# Patient Record
Sex: Male | Born: 1935 | Race: White | Hispanic: No | Smoking: Former smoker
Health system: Southern US, Community
[De-identification: ages and names within clinical notes are randomized; demographics above are authoritative.]

## PROBLEM LIST (undated history)

## (undated) DIAGNOSIS — J449 Chronic obstructive pulmonary disease, unspecified: Secondary | ICD-10-CM

## (undated) DIAGNOSIS — H9193 Unspecified hearing loss, bilateral: Secondary | ICD-10-CM

## (undated) DIAGNOSIS — R7303 Prediabetes: Secondary | ICD-10-CM

## (undated) DIAGNOSIS — E119 Type 2 diabetes mellitus without complications: Secondary | ICD-10-CM

## (undated) DIAGNOSIS — Z9889 Other specified postprocedural states: Secondary | ICD-10-CM

## (undated) HISTORY — PX: VASECTOMY: SHX75

## (undated) HISTORY — PX: TONSILLECTOMY: SUR1361

## (undated) HISTORY — DX: Chronic obstructive pulmonary disease, unspecified: J44.9

## (undated) HISTORY — DX: Unspecified hearing loss, bilateral: H91.93

## (undated) HISTORY — DX: Other specified postprocedural states: Z98.890

## (undated) HISTORY — PX: CARDIAC CATHETERIZATION: SHX172

## (undated) HISTORY — DX: Type 2 diabetes mellitus without complications: E11.9

---

## 2000-11-24 ENCOUNTER — Ambulatory Visit: Admission: RE | Admit: 2000-11-24 | Discharge: 2000-11-24 | Payer: Self-pay | Admitting: Internal Medicine

## 2005-04-26 ENCOUNTER — Ambulatory Visit (HOSPITAL_COMMUNITY): Admission: RE | Admit: 2005-04-26 | Discharge: 2005-04-26 | Payer: Self-pay | Admitting: Pulmonary Disease

## 2005-07-24 ENCOUNTER — Ambulatory Visit: Payer: Self-pay | Admitting: *Deleted

## 2005-07-24 ENCOUNTER — Inpatient Hospital Stay (HOSPITAL_COMMUNITY): Admission: AD | Admit: 2005-07-24 | Discharge: 2005-07-25 | Payer: Self-pay | Admitting: Cardiology

## 2005-07-25 ENCOUNTER — Encounter: Payer: Self-pay | Admitting: Cardiology

## 2009-05-12 ENCOUNTER — Ambulatory Visit (HOSPITAL_COMMUNITY): Admission: RE | Admit: 2009-05-12 | Discharge: 2009-05-12 | Payer: Self-pay | Admitting: Pulmonary Disease

## 2009-05-25 ENCOUNTER — Ambulatory Visit: Payer: Self-pay | Admitting: Cardiology

## 2009-05-25 ENCOUNTER — Ambulatory Visit (HOSPITAL_COMMUNITY): Admission: RE | Admit: 2009-05-25 | Discharge: 2009-05-25 | Payer: Self-pay | Admitting: Pulmonary Disease

## 2009-05-25 ENCOUNTER — Encounter (INDEPENDENT_AMBULATORY_CARE_PROVIDER_SITE_OTHER): Payer: Self-pay | Admitting: Pulmonary Disease

## 2009-06-16 ENCOUNTER — Ambulatory Visit (HOSPITAL_COMMUNITY): Admission: RE | Admit: 2009-06-16 | Discharge: 2009-06-16 | Payer: Self-pay | Admitting: Pulmonary Disease

## 2010-05-01 LAB — BLOOD GAS, ARTERIAL
Bicarbonate: 25.2 mEq/L — ABNORMAL HIGH (ref 20.0–24.0)
FIO2: 21 %
pCO2 arterial: 40.8 mmHg (ref 35.0–45.0)

## 2010-06-29 NOTE — Procedures (Signed)
NAME:  Jeremy Leon, Jeremy Leon NO.:  [id number]   MEDICAL RECORD NO.:  [id number]          PATIENT TYPE:  OUT   LOCATION:                                FACILITY:  [facility]   PHYSICIAN:  Edward L. [name], M.D.DATE OF BIRTH:  06/29/1932   DATE OF PROCEDURE:  05/01/2005  DATE OF DISCHARGE:                              PULMONARY FUNCTION TEST   RESULTS:  1.  Spirometry shows a severe ventilatory defect with evidence of airflow      obstruction.  2.  Lung volumes show no evidence of restrictive lung disease.  3.  DLCO is mildly reduced.  4.  Arterial blood gases are normal.  5.  There is significant improvement with inhaled bronchodilator.      Edward L. [name], M.D.  Electronically Signed     ELH/MEDQ  D:  05/01/2005  T:  05/02/2005  Job:  [id number]

## 2010-06-29 NOTE — H&P (Signed)
NAME:  Jeremy Leon, Jeremy Leon NO.:  [id number]   MEDICAL RECORD NO.:  [id number]           PATIENT TYPE:   LOCATION:                                 FACILITY:   PHYSICIAN:  Vida Roller, M.D.   DATE OF BIRTH:  July 31, 1932   DATE OF ADMISSION:  07/24/2005  DATE OF DISCHARGE:                                HISTORY & PHYSICAL   PRIMARY Jeremy Leon:  Jeremy Leon. Jeremy Leon, M.D.   HISTORY OF PRESENT ILLNESS:  Jeremy Leon is a 77 year old man with no  significant past medical history, other than emphysema, who presents to my  office today on referral from Dr. Juanetta Leon with 2 days of discomfort in his  chest.  He states it is a fullness in the center of his chest that radiates  towards his neck.  It is associated with exertion.  He has had it on and off  for the last day or so.  It gets worse when he is physically active.  He  went to a funeral yesterday, and he was standing and walking and was a  little bit emotionally distraught over the deceased.  He had worsening  discomfort in his chest.  It went away.  He was able to sleep last night,  but got significantly worse this morning without exertion.  He has now had  about 7 or 8 hours of discomfort.  He was seen in Dr. Juanetta Leon' office.  An  electrocardiogram was performed, which did not show any acute ST-T changes,  and he was referred to my office for evaluation.  He states that the  discomfort gets worse when he takes a deep breath.  It sometimes goes up  into his neck.  It is rated currently at about a 2 now.  No PND or  orthopnea; however, he says it feels more comfortable when he sits up in a  recliner.   PAST MEDICAL HISTORY:  COPD.  He has an extensive previous tobacco history,  but quit smoking in 1999.  He denies diabetes or hypertension.  He does not  have a significant family history of coronary disease.   PAST SURGICAL HISTORY:  He has had a tonsillectomy and a vasectomy in the  past without difficulty.  The  tonsillectomy was when he was a child.   CURRENT MEDICATIONS:  1.  Advair 250/50 two puffs once a day.  2.  Testosterone gel he uses once a day.  3.  Multivitamin once a day.  4.  Aspirin 81 mg once a day, and he took that this morning.  5.  Vitamin E once a day.   REVIEW OF SYSTEMS:  Generally negative.  He has had an echocardiogram which  was performed in March of 2007, interpreted by me colleague, Dr. Kem Boroughs at [facility], which shows preserved LV  systolic function, no significant valvular heart disease, evidence of mild  left atrial enlargement with a question of diastolic relaxation abnormality  and a mildly dilated right ventricle with preserved right ventricular  systolic function.   PHYSICAL EXAMINATION:  VITAL SIGNS:  He is  229 pounds.  Blood pressure is  120/74, pulse 84.  HEENT:  Unremarkable.  NECK:  Supple.  I do not hear any carotid bruits or jugular venous  distention.  CHEST:  Decreased breath sounds at the bases with reasonably poor air  movement throughout.  No wheezes are noted.  No rales are noted.  CARDIOVASCULAR:  Somewhat distant without a significant murmur.  Regular  first and second heart sounds.  EXTREMITIES:  His lower extremities are without edema.  His pulses are 1+  throughout.  I do not hear any femoral bruits.   He had a chest x-ray showing emphysema back in March.  His  electrocardiogram, which was done in Dr. Juanetta Leon' office, showed sinus  rhythm at a rate of 89 with normal intervals.  Axis is normal.  He has no ST-  T wave changes.  There are no Q waves concerning for old myocardial  infarction.  Essentially, an unremarkable electrocardiogram.  I have no  laboratories and no recent chest x-ray.   So this is a gentleman with active discomfort in his chest which is very  concerning for unstable angina.  He will need to be admitted to the  hospital.  He has chosen to be admitted to [facility], so we will transport   him down via [facility].  I would like for him to be anticoagulated with  Lovenox.  He will need an aspirin.  He will need nitroglycerin infusion to  relief his chest discomfort.  He will need to have his enzymes cycled.  Will  need to check a D-dimer, and I would plan to take this gentleman to the  heart catheterization laboratory if he manifests any evidence of active  ischemia.  At this point, his characteristics of his pain, although quite  concerning and active, are not classic for coronary artery disease,  especially with the pleuritic nature to the pain.  Certainly, a pulmonary  embolism is worth at least considering, and this could also just be a COPD  exacerbation.  But at this point I have limited ability to differentiate  that, and I will treat him for the most significant potentially life-  threatening problem.      Vida Roller, M.D.  Electronically Signed     JH/MEDQ  D:  07/24/2005  T:  07/24/2005  Job:  [id number]   cc:   Ramon Dredge L. Jeremy Leon, M.D.  Fax: [phone]

## 2010-06-29 NOTE — Discharge Summary (Signed)
Jeremy Leon, Jeremy Leon NO.:  [id number]   MEDICAL RECORD NO.:  [id number]          PATIENT TYPE:  INP   LOCATION:  2910                         FACILITY:  [facility]   PHYSICIAN:  Theodore Demark, P.A. LHCDATE OF BIRTH:  Jul 11, 1932   DATE OF ADMISSION:  07/24/2005  DATE OF DISCHARGE:  07/25/2005                                 DISCHARGE SUMMARY   PROCEDURES:  1.  Cardiac catheterization.  2.  Coronary arteriogram.  3.  Left ventriculogram.  4.  CT of the chest with contrast.  5.  2-D echocardiogram.   TIME OF DISCHARGE:  36 minutes.   CHIEF COMPLAINT:  Chest pain, no coronary artery disease by cath and no PE  or dissection by CT of the chest.   SECONDARY DIAGNOSIS:  1.  Chronic obstructive pulmonary disease.  2.  Remote history of tobacco use.  3.  Status post tonsillectomy and vasectomy.  4.  Mild benign prostatic hypertrophy.  5.  Mild hearing loss.   HOSPITAL COURSE:  Jeremy Leon is a 77 year old male with no previous  history of coronary artery disease.  He was evaluated by Dr. [name] in the  office on July 24, 2005, for chest pain.  Jeremy Leon states that his  chest pain would come and go.  It was made worse by exertion or worse with  deep inspiration.  He was evaluated by Dr. [name] and the exertional  component of his chest pain was concerning for unstable anginal pain.  He  was transferred by [facility] to [facility].  En route to [facility] he had transient bradycardia and loss of consciousness.  He was  taken directly to the cath lab.  Cardiac catheterization showed no  significant coronary artery disease and preserved left ventricular function.  He was admitted for observation.  A D-dimer was checked and was elevated at  0.79.  The chest CT was performed and showed no PE or dissection.  Of note,  he had bilateral renal cysts and diverticulosis.  He also had gallstones but  no cholecystitis.  A 2-D echocardiogram was performed  and showed normal left  ventricular systolic function with no pericardial effusion, no pericarditis,  and no significant valvular abnormalities.  As part of his evaluation, a  lipid profile was performed and showed a total cholesterol of 173,  triglycerides 107, HDL 35, LDL 117.  He is encouraged to stick to a low-fat  diet and follow up with his family physician.  Of note, he was hyperglycemic  with blood sugars of 120 and 131.  A hemoglobin A1c was not performed.  TSH  was within normal limits.  He can follow up with Dr. [name] for further  evaluation.   On July 25, 2005, Jeremy Leon was still complaining of some pleuritic  chest pain but it was improved by Naprosyn.  Dr. Dietrich Pates felt that it was  likely chest wall pain with the pleuritic component to it.  Because a CT,  echocardiogram, and cardiac catheterization were all negative, he was  considered stable for discharge on July 25, 2005, with a non-steroidal  anti-  inflammatory drugs for pain control and follow up with Dr. [name].   DISCHARGE INSTRUCTIONS:  His activity level is to be reduced for the next 48  hours.  He is to stick to a low-fat diet.  He is to call our office for  problems with the cath site.  He is to follow up with Dr. [name] next week  as scheduled and follow up with Dr. [name] as needed.   DISCHARGE MEDICATIONS:  1.  Naprosyn 500 mg b.i.d.  2.  Aspirin 81 mg a day.  3.  Advair 2 puffs b.i.d.  4.  Testosterone gel once a day.  5.  Multivitamin and vitamin E once a day.      Theodore Demark, P.A. [facility]     RB/MEDQ  D:  07/25/2005  T:  07/25/2005  Job:  [id number]   cc:   Ramon Dredge L. [name], M.D.  Fax: [phone]

## 2010-06-29 NOTE — Cardiovascular Report (Signed)
NAMEJANOS, SHAMPINE NO.:  [id number]   MEDICAL RECORD NO.:  [id number]          PATIENT TYPE:  INP   LOCATION:  2807                         FACILITY:  [facility]   PHYSICIAN:  Arturo Morton. Riley Kill, M.D. [facility] OF BIRTH:  [birth year]   DATE OF PROCEDURE:  07/24/2005  DATE OF DISCHARGE:                              CARDIAC CATHETERIZATION   INDICATIONS:  Mr. Lehrmann is a 77 year old who presents with fairly  significant chest pain.  There was mild ST elevation noted on the inferior  leads.  He also had a vagal response after nitroglycerin was started.  The  patient is on a daily aspirin.  He quit smoking in 1999 but does have  underlying chronic lung disease.   PROCEDURE:  1.  Left heart catheterization.  2.  Selective coronary arteriography.  3.  Selective left ventriculography.  4.  Aortic root aortography.   DESCRIPTION OF PROCEDURE:  The patient was brought to the catheterization  laboratory, prepped and draped in usual fashion. Through an anterior  puncture the right femoral artery was easily entered and 6-French sheath was  placed. Views of left and right coronaries were obtained in multiple  angiographic projections.  Central aortic and left ventricular pressures  were measured with pigtail.  Ventriculography was performed in the RAO  projection. Because of the unidentified chest pain with the proximal root  aortography to exclude dissection.  The pigtail catheter was then  subsequently removed. I spoke with the patient's wife.  I subsequently  called Dr. [name], in Dr. [name].  I also notified Dr. Diona Browner.  The  patient was taken to the holding area in satisfactory clinical condition for  direct manual hemostasis.  There are no complications noted.   HEMODYNAMIC DATA.:  1.  Central aortic pressure 110/72, mean 87.  2.  Left atrial pressure 113/20.  3.  No gradient pullback across aortic valve.   ANGIOGRAPHIC DATA.:  1.  The left main coronary artery  is fairly large-caliber vessel and free of      critical disease.  2.  The LAD courses to the apex.  There was slightly sluggish flow down the      LAD, but there was no obvious area of high-grade focal obstruction.      There is a fair size diagonal branch that bifurcated distally.  This did      not appear to have significant obstruction.  There is a second diagonal      branch that was also fairly large in size and again without significant      focal narrowing.  3.  The circumflex has an insignificant ramus vessel.  There is a large      first marginal branch that is somewhat tortuous in its midportion, high-      grade critical disease is not noted.  There is a smaller second marginal      branch and moderate size third marginal branch all of which appear to be      free of critical obstruction.  4.  The right coronary is a large-caliber vessel.  Provides posterior  descending and a posterolateral vessel that has two major sub branches.      The right coronary artery appears to be free of significant disease and      smooth throughout without high-grade obstruction.  5.  The ventriculogram done in the RAO projection reveals vigorous global      systolic function.  Ejection fraction appears to be in excess of 55%.      There is slight delayed pull-up of the apex, but there does not appear      to be akinesis in this territory.  6.  Proximal root aortogram demonstrates no aortic regurgitation.  Aortic      root size appears to be upper normal.  There is no evidence of aortic      dissection that is evident.   CONCLUSION:  1.  Well-preserved left ventricular function.  2.  No evidence of aortic dissection.  3.  No critical coronary obstruction.   DISPOSITION:  We will get a D-dimer.  The patient will be put in the unit.  Serial enzymes obtained.  We will potentially look for other sources of  chest pain and keep him under 24-hour observation.      Arturo Morton. Riley Kill, M.D.  [facility]  Electronically Signed     TDS/MEDQ  D:  07/24/2005  T:  07/24/2005  Job:  [id number]   cc:   Ramon Dredge L. [name], M.D.  Fax: [phone]   Vida Roller, M.D.  Fax: [phone]

## 2010-06-29 NOTE — Procedures (Signed)
NAME:  Jeremy Leon, Jeremy Leon NO.:  [id number]   MEDICAL RECORD NO.:  [id number]          PATIENT TYPE:  OUT   LOCATION:  RAD                           FACILITY:  [facility]   PHYSICIAN:  Dani Gobble, MD       DATE OF BIRTH:  April 09, 1932   DATE OF PROCEDURE:  04/26/2005  DATE OF DISCHARGE:                                  ECHOCARDIOGRAM   REFERRING PHYSICIAN:  Edward L. [name], M.D.   INDICATIONS:  A 77 year old gentleman without prior cardiac history who has  been short of breath.   The technical quality of the study is limited secondary to patient body  habitus and poor acoustic windows.   M-MODE TRACINGS:  The aorta grossly appears normal in size.   Left atrium appears mildly dilated.   The interventricular septum and posterior wall may be borderline thickened.   The aortic valve was not well-visualized, but appeared to be grossly  structurally normal with normal leaflet excursion.  No significant aortic  insufficiency is noted.  Doppler interrogation of the aortic valve is within  normal limits.   Mitral valve also appears grossly structurally normal.  No significant  mitral regurgitation is noted on color-flow Doppler.  Doppler interrogation  of mitral valve is within normal limits..   The pulmonic valve is not well-visualized.   Tricuspid valve appears grossly structurally normal with mild tricuspid  regurgitation noted.   Left ventricle subjectively appears normal in size.  Overall ejection  fraction is normal.  The endocardium is not well-visualized, but no obvious  regional wall motion abnormalities are noted.  The presence of diastolic  dysfunction is inferred from pulse wave Doppler across the mitral valve.   The right atrium and right ventricle are dilated, but right ventricular  systolic function is preserved.   IMPRESSION:  1.  Technically difficult study.  2.  Mild left atrial enlargement.  3.  Borderline concentric left ventricular  hypertrophy.  4.  Mild tricuspid regurgitation.  5.  Normal left ventricular size and systolic function without obvious      regional wall motion abnormality, although      the endocardium is not well-visualized in all views.  6.  The dilated right atrium and right ventricle with preserved right      ventricular systolic function.  7.  Presence of diastolic dysfunction is inferred from pulse wave Doppler      across the mitral valve.           ______________________________  Dani Gobble, MD     AB/MEDQ  D:  04/26/2005  T:  04/27/2005  Job:  [id number]

## 2014-06-07 DIAGNOSIS — H2513 Age-related nuclear cataract, bilateral: Secondary | ICD-10-CM | POA: Diagnosis not present

## 2014-10-28 ENCOUNTER — Encounter: Payer: Self-pay | Admitting: Cardiology

## 2014-10-28 ENCOUNTER — Ambulatory Visit (INDEPENDENT_AMBULATORY_CARE_PROVIDER_SITE_OTHER): Payer: Medicare Other | Admitting: Cardiology

## 2014-10-28 VITALS — BP 150/70 | HR 63 | Ht 70.0 in | Wt 215.4 lb

## 2014-10-28 DIAGNOSIS — Z79899 Other long term (current) drug therapy: Secondary | ICD-10-CM

## 2014-10-28 DIAGNOSIS — J449 Chronic obstructive pulmonary disease, unspecified: Secondary | ICD-10-CM | POA: Diagnosis not present

## 2014-10-28 DIAGNOSIS — Z136 Encounter for screening for cardiovascular disorders: Secondary | ICD-10-CM | POA: Diagnosis not present

## 2014-10-28 DIAGNOSIS — R0602 Shortness of breath: Secondary | ICD-10-CM | POA: Diagnosis not present

## 2014-10-28 DIAGNOSIS — R0789 Other chest pain: Secondary | ICD-10-CM

## 2014-10-28 LAB — CBC WITH DIFFERENTIAL/PLATELET
BASOS ABS: 0 10*3/uL (ref 0.0–0.1)
BASOS PCT: 0 % (ref 0–1)
EOS ABS: 0.2 10*3/uL (ref 0.0–0.7)
Eosinophils Relative: 2 % (ref 0–5)
HCT: 44.8 % (ref 39.0–52.0)
Hemoglobin: 15 g/dL (ref 13.0–17.0)
Lymphocytes Relative: 26 % (ref 12–46)
Lymphs Abs: 2.4 10*3/uL (ref 0.7–4.0)
MCH: 29.9 pg (ref 26.0–34.0)
MCHC: 33.5 g/dL (ref 30.0–36.0)
MCV: 89.4 fL (ref 78.0–100.0)
MONOS PCT: 7 % (ref 3–12)
MPV: 10.5 fL (ref 8.6–12.4)
Monocytes Absolute: 0.6 10*3/uL (ref 0.1–1.0)
NEUTROS PCT: 65 % (ref 43–77)
Neutro Abs: 6 10*3/uL (ref 1.7–7.7)
PLATELETS: 253 10*3/uL (ref 150–400)
RBC: 5.01 MIL/uL (ref 4.22–5.81)
RDW: 13.4 % (ref 11.5–15.5)
WBC: 9.2 10*3/uL (ref 4.0–10.5)

## 2014-10-28 LAB — BASIC METABOLIC PANEL
BUN: 16 mg/dL (ref 7–25)
CALCIUM: 9.3 mg/dL (ref 8.6–10.3)
CO2: 26 mmol/L (ref 20–31)
Chloride: 107 mmol/L (ref 98–110)
Creat: 1.13 mg/dL — ABNORMAL HIGH (ref 0.70–1.11)
Glucose, Bld: 135 mg/dL — ABNORMAL HIGH (ref 65–99)
Potassium: 4.5 mmol/L (ref 3.5–5.3)
SODIUM: 141 mmol/L (ref 135–146)

## 2014-10-28 NOTE — Patient Instructions (Signed)
Your physician recommends that you schedule a follow-up appointment in: BASED ON TEST RESULTS  Your physician has requested that you have a lexiscan myoview. For further information please visit [email or web address] Please follow instruction sheet, as given.  Your physician has requested that you have an echocardiogram. Echocardiography is a painless test that uses sound waves to create images of your heart. It provides your doctor with information about the size and shape of your heart and how well your heart's chambers and valves are working. This procedure takes approximately one hour. There are no restrictions for this procedure.  Your physician recommends that you return for lab work in: TODAY- BMET, CBC  Thanks for choosing [facility]!!!

## 2014-10-28 NOTE — Progress Notes (Signed)
Cardiology Office Note  Date: 10/28/2014   ID: Jeremy Leon, DOB 02-15-1932, MRN [id number]  PCP: Fredirick Maudlin, MD  Consulting Cardiologist: Nona Dell, MD   Chief Complaint  Patient presents with  . Shortness of Breath    History of Present Illness: Jeremy Leon is an 81 y.o. male self-referred for cardiology evaluation. He was seen in our practice in the past, by Dr. [name] back in 2007. I reviewed the available records. He follows with Dr. [name] with a history of COPD, but has not seen him in a few years.  He presents today with his wife, describes a long-standing history of dyspnea on exertion and NYHA class 2-3. He has had worsening symptoms over the hot summer months, states that within the last week he has felt somewhat better. At times he feels a vague discomfort in the left lower portion of his thorax not consistently with activity. Also states that he "sweats a lot" when he exerts himself. He has had no palpitations or syncope.  Cardiac evaluation includes cardiac catheterization in 2007 which did not demonstrate any significant CAD. ECG today shows sinus rhythm with decreased R wave progression.  We reviewed his medications, no recent additions. He reports regular use of his pulmonary medication. Very rarely has to use a rescue inhaler. He has not had any recent lab work.   Past Medical History  Diagnosis Date  . COPD (chronic obstructive pulmonary disease)   . History of cardiac catheterization     No significant CAD 2007  . Decreased hearing of both ears     Past Surgical History  Procedure Laterality Date  . Tonsillectomy    . Vasectomy      Current Outpatient Prescriptions  Medication Sig Dispense Refill  . aspirin 81 MG tablet Take 81 mg by mouth daily.    . cyanocobalamin 1000 MCG tablet Take 100 mcg by mouth daily.    . Omega-3 Fatty Acids (FISH OIL) 1000 MG CPDR Take 1,000 mg by mouth.    . tiotropium (SPIRIVA) 18 MCG inhalation  capsule Place 18 mcg into inhaler and inhale daily.    . Vitamin D-Vitamin K (D3 + K2 DOTS) 1000-90 UNIT-MCG TABS Take by mouth.     No current facility-administered medications for this visit.    Allergies:  Review of patient's allergies indicates not on file.   Social History: The patient  reports that he quit smoking about 17 years ago. His smoking use included Cigarettes. He does not have any smokeless tobacco history on file. He reports that he does not drink alcohol or use illicit drugs.   Family History: The patient's family history includes Colon cancer in his father; Diabetes Mellitus II in his brother; Leukemia in his brother; Stroke in his mother.   ROS:  Please see the history of present illness. Otherwise, complete review of systems is positive for decreased hearing.  All other systems are reviewed and negative.   Physical Exam: VS:  BP 150/70 mmHg  Pulse 63  Ht 5\' 10"  (1.778 m)  Wt 215 lb 6.4 oz (97.705 kg)  BMI 30.91 kg/m2  SpO2 92%, BMI Body mass index is 30.91 kg/(m^2).  Wt Readings from Last 3 Encounters:  10/28/14 215 lb 6.4 oz (97.705 kg)     General: Overweight male, appears comfortable at rest. HEENT: Conjunctiva and lids normal, oropharynx clear with moist mucosa. Neck: Supple, no elevated JVP or carotid bruits, no thyromegaly. Lungs: Decreased breath sounds without wheezing, nonlabored breathing  at rest. Cardiac: Regular rate and rhythm, no S3 or significant systolic murmur, no pericardial rub. Abdomen: Soft, nontender, bowel sounds present, no guarding or rebound. Extremities: No pitting edema, distal pulses 2+. Skin: Warm and dry. Musculoskeletal: No kyphosis. Neuropsychiatric: Alert and oriented x3, affect grossly appropriate.   ECG: ECG is ordered today.  Other Studies Reviewed Today:  Echocardiogram 05/25/2009: Study Conclusions  - Left ventricle: The cavity size was normal. Wall thickness was  increased. Systolic function was normal. The  estimated ejection  fraction was in the range of 55% to 60%. Suboptimal images failed  to identify aregional wall motion abnormalities. - Aortic valve: Mildly to moderately calcified annulus. Mildly  calcified leaflets. - Right ventricle: The cavity size was normal. Wall thickness was  mildly increased. Impressions:  - Compared to the prior study of 07/25/2005, there has been no  significant interval change.  ASSESSMENT AND PLAN:  1. Acute on chronic dyspnea on exertion with diaphoresis and atypical chest discomfort at times. ECG is overall nonspecific. Previous cardiac workup was reassuring including no significant CAD at heart catheterization in 2007. He also has COPD by history. We had him ambulate in the office on room air with no significant oxygen desaturation documented below 92%. We will send basic lab work including CBC and BMET. Schedule an echocardiogram and a Lexiscan Cardiolite for follow-up cardiac structural and ischemic assessment. If reassuring, may need further pulmonary evaluation by Dr. [name].  2. COPD by history, quit tobacco use in 1999.  Current medicines were reviewed at length with the patient today.   Orders Placed This Encounter  Procedures  . NM Myocar Multi W/Spect W/Wall Motion / EF  . CBC with Differential  . Basic Metabolic Panel (BMET)  . Myocardial Perfusion Imaging  . EKG 12-Lead  . Echocardiogram    Disposition: Call with results.   Signed, Jonelle Sidle, MD, [facility] 10/28/2014 9:36 AM    [facility] [address] [place], [place] [address] Phone: [phone]; Fax: [phone]

## 2014-11-01 ENCOUNTER — Ambulatory Visit (HOSPITAL_COMMUNITY)
Admission: RE | Admit: 2014-11-01 | Discharge: 2014-11-01 | Disposition: A | Discharge status: Home / Self Care | Payer: Medicare Other | Source: Ambulatory Visit | Attending: Cardiology | Admitting: Cardiology

## 2014-11-01 ENCOUNTER — Encounter (HOSPITAL_COMMUNITY)
Admission: RE | Admit: 2014-11-01 | Discharge: 2014-11-01 | Disposition: A | Discharge status: Home / Self Care | Payer: Medicare Other | Source: Ambulatory Visit | Attending: Cardiology | Admitting: Cardiology

## 2014-11-01 ENCOUNTER — Encounter (HOSPITAL_COMMUNITY): Payer: Self-pay

## 2014-11-01 ENCOUNTER — Inpatient Hospital Stay (HOSPITAL_COMMUNITY): Admission: RE | Admit: 2014-11-01 | Payer: Medicare Other | Source: Ambulatory Visit

## 2014-11-01 DIAGNOSIS — R0602 Shortness of breath: Secondary | ICD-10-CM | POA: Insufficient documentation

## 2014-11-01 DIAGNOSIS — I081 Rheumatic disorders of both mitral and tricuspid valves: Secondary | ICD-10-CM | POA: Diagnosis not present

## 2014-11-01 LAB — NM MYOCAR MULTI W/SPECT W/WALL MOTION / EF
CHL CUP NUCLEAR SRS: 7
CHL CUP NUCLEAR SSS: 7
LHR: 0.37
LV sys vol: 30 mL
LVDIAVOL: 79 mL
NUC STRESS TID: 1.11
Peak HR: 85 {beats}/min
Rest HR: 64 {beats}/min
SDS: 0

## 2014-11-01 MED ORDER — TECHNETIUM TC 99M SESTAMIBI GENERIC - CARDIOLITE
10.0000 | Freq: Once | INTRAVENOUS | Status: AC | PRN
  Administered 2014-11-01: 10 via INTRAVENOUS

## 2014-11-01 MED ORDER — TECHNETIUM TC 99M SESTAMIBI - CARDIOLITE
30.0000 | Freq: Once | INTRAVENOUS | Status: AC | PRN
  Administered 2014-11-01: 30 via INTRAVENOUS

## 2014-11-01 MED ORDER — REGADENOSON 0.4 MG/5ML IV SOLN
INTRAVENOUS | Status: AC
  Administered 2014-11-01: 0.4 mg via INTRAVENOUS
  Filled 2014-11-01: qty 5

## 2014-11-01 MED ORDER — SODIUM CHLORIDE 0.9 % IJ SOLN
INTRAMUSCULAR | Status: AC
  Administered 2014-11-01: 10 mL via INTRAVENOUS
  Filled 2014-11-01: qty 3

## 2016-07-25 IMAGING — NM NM MYOCAR MULTI W/SPECT W/WALL MOTION & EF
2 series · 12 of 12 positions shown · non-contrast
Comparison: none

[Series 1: rest · 8.28mm/px · 6 of 64 frames shown]
[frame 6/64]
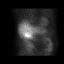
[frame 16/64]
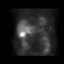
[frame 27/64]
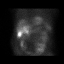
[frame 38/64]
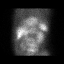
[frame 48/64]
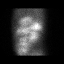
[frame 59/64]
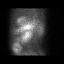

[Series 2: stress gated · 8.28mm/px · 6 of 64 frames shown]
[frame 6/64]
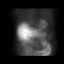
[frame 16/64]
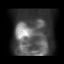
[frame 27/64]
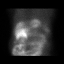
[frame 38/64]
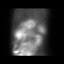
[frame 48/64]
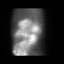
[frame 59/64]
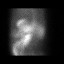

[12 of 12 positions shown; findings below may reference images not displayed]

Canned report from images found in remote index.

Refer to host system for actual result text.

## 2016-08-12 ENCOUNTER — Observation Stay (HOSPITAL_COMMUNITY)
Admission: EM | Admit: 2016-08-12 | Discharge: 2016-08-14 | Disposition: A | Discharge status: Home / Self Care | Payer: Medicare Other | Attending: Pulmonary Disease | Admitting: Pulmonary Disease

## 2016-08-12 ENCOUNTER — Encounter (HOSPITAL_COMMUNITY): Payer: Self-pay | Admitting: Emergency Medicine

## 2016-08-12 ENCOUNTER — Emergency Department (HOSPITAL_COMMUNITY): Payer: Medicare Other

## 2016-08-12 DIAGNOSIS — J9601 Acute respiratory failure with hypoxia: Secondary | ICD-10-CM | POA: Diagnosis not present

## 2016-08-12 DIAGNOSIS — R05 Cough: Secondary | ICD-10-CM | POA: Diagnosis not present

## 2016-08-12 DIAGNOSIS — R0603 Acute respiratory distress: Secondary | ICD-10-CM | POA: Diagnosis not present

## 2016-08-12 DIAGNOSIS — Z79899 Other long term (current) drug therapy: Secondary | ICD-10-CM | POA: Insufficient documentation

## 2016-08-12 DIAGNOSIS — E119 Type 2 diabetes mellitus without complications: Secondary | ICD-10-CM

## 2016-08-12 DIAGNOSIS — J441 Chronic obstructive pulmonary disease with (acute) exacerbation: Principal | ICD-10-CM | POA: Diagnosis present

## 2016-08-12 DIAGNOSIS — Z87891 Personal history of nicotine dependence: Secondary | ICD-10-CM | POA: Diagnosis not present

## 2016-08-12 DIAGNOSIS — E1140 Type 2 diabetes mellitus with diabetic neuropathy, unspecified: Secondary | ICD-10-CM

## 2016-08-12 DIAGNOSIS — Z7982 Long term (current) use of aspirin: Secondary | ICD-10-CM | POA: Insufficient documentation

## 2016-08-12 DIAGNOSIS — J449 Chronic obstructive pulmonary disease, unspecified: Secondary | ICD-10-CM | POA: Diagnosis present

## 2016-08-12 DIAGNOSIS — E871 Hypo-osmolality and hyponatremia: Secondary | ICD-10-CM | POA: Diagnosis not present

## 2016-08-12 DIAGNOSIS — R0602 Shortness of breath: Secondary | ICD-10-CM | POA: Diagnosis not present

## 2016-08-12 HISTORY — DX: Prediabetes: R73.03

## 2016-08-12 LAB — CBC WITH DIFFERENTIAL/PLATELET
BASOS ABS: 0 10*3/uL (ref 0.0–0.1)
BASOS PCT: 0 %
EOS ABS: 0 10*3/uL (ref 0.0–0.7)
EOS PCT: 0 %
HCT: 40.1 % (ref 39.0–52.0)
Hemoglobin: 14.1 g/dL (ref 13.0–17.0)
Lymphocytes Relative: 12 %
Lymphs Abs: 1.7 10*3/uL (ref 0.7–4.0)
MCH: 30.5 pg (ref 26.0–34.0)
MCHC: 35.2 g/dL (ref 30.0–36.0)
MCV: 86.6 fL (ref 78.0–100.0)
MONO ABS: 1.4 10*3/uL — AB (ref 0.1–1.0)
Monocytes Relative: 10 %
Neutro Abs: 11.1 10*3/uL — ABNORMAL HIGH (ref 1.7–7.7)
Neutrophils Relative %: 78 %
PLATELETS: 209 10*3/uL (ref 150–400)
RBC: 4.63 MIL/uL (ref 4.22–5.81)
RDW: 12.8 % (ref 11.5–15.5)
WBC: 14.2 10*3/uL — ABNORMAL HIGH (ref 4.0–10.5)

## 2016-08-12 LAB — BASIC METABOLIC PANEL
ANION GAP: 12 (ref 5–15)
BUN: 19 mg/dL (ref 6–20)
CALCIUM: 8.7 mg/dL — AB (ref 8.9–10.3)
CO2: 21 mmol/L — ABNORMAL LOW (ref 22–32)
Chloride: 100 mmol/L — ABNORMAL LOW (ref 101–111)
Creatinine, Ser: 0.99 mg/dL (ref 0.61–1.24)
GLUCOSE: 293 mg/dL — AB (ref 65–99)
Potassium: 3.8 mmol/L (ref 3.5–5.1)
Sodium: 133 mmol/L — ABNORMAL LOW (ref 135–145)

## 2016-08-12 LAB — BLOOD GAS, ARTERIAL
Acid-base deficit: 5.7 mmol/L — ABNORMAL HIGH (ref 0.0–2.0)
Bicarbonate: 20.6 mmol/L (ref 20.0–28.0)
Drawn by: 23534
FIO2: 0.21
O2 CONTENT: 21 L/min
O2 Saturation: 90.4 %
PCO2 ART: 28.8 mmHg — AB (ref 32.0–48.0)
pH, Arterial: 7.414 (ref 7.350–7.450)
pO2, Arterial: 61 mmHg — ABNORMAL LOW (ref 83.0–108.0)

## 2016-08-12 LAB — TROPONIN I: Troponin I: <0.03 ng/mL (ref <0.03)

## 2016-08-12 LAB — GLUCOSE, CAPILLARY
GLUCOSE-CAPILLARY: 397 mg/dL — AB (ref 65–99)
GLUCOSE-CAPILLARY: 387 mg/dL — AB (ref 65–99)
Glucose-Capillary: 352 mg/dL — ABNORMAL HIGH (ref 65–99)

## 2016-08-12 LAB — BRAIN NATRIURETIC PEPTIDE: B Natriuretic Peptide: 108 pg/mL — ABNORMAL HIGH (ref 0.0–100.0)

## 2016-08-12 MED ORDER — LEVOFLOXACIN IN D5W 750 MG/150ML IV SOLN: 750.0000 mg | INTRAVENOUS | Status: DC

## 2016-08-12 MED ORDER — INSULIN GLARGINE 100 UNIT/ML ~~LOC~~ SOLN
10.0000 [IU] | Freq: Two times a day (BID) | SUBCUTANEOUS | Status: DC
  Administered 2016-08-12 – 2016-08-14 (×5): 10 [IU] via SUBCUTANEOUS
  Filled 2016-08-12 (×7): qty 0.1

## 2016-08-12 MED ORDER — MAGNESIUM OXIDE 400 (241.3 MG) MG PO TABS
400.0000 mg | ORAL_TABLET | Freq: Every day | ORAL | Status: DC
  Administered 2016-08-13 – 2016-08-14 (×2): 400 mg via ORAL
  Filled 2016-08-12 (×2): qty 1

## 2016-08-12 MED ORDER — IPRATROPIUM-ALBUTEROL 0.5-2.5 (3) MG/3ML IN SOLN
3.0000 mL | Freq: Four times a day (QID) | RESPIRATORY_TRACT | Status: DC
  Administered 2016-08-12 (×2): 3 mL via RESPIRATORY_TRACT
  Filled 2016-08-12 (×2): qty 3

## 2016-08-12 MED ORDER — DOCUSATE SODIUM 100 MG PO CAPS
100.0000 mg | ORAL_CAPSULE | Freq: Two times a day (BID) | ORAL | Status: DC
  Administered 2016-08-12 – 2016-08-14 (×5): 100 mg via ORAL
  Filled 2016-08-12 (×5): qty 1

## 2016-08-12 MED ORDER — LEVOFLOXACIN IN D5W 750 MG/150ML IV SOLN
750.0000 mg | Freq: Once | INTRAVENOUS | Status: AC
  Administered 2016-08-12: 750 mg via INTRAVENOUS
  Filled 2016-08-12: qty 150

## 2016-08-12 MED ORDER — GUAIFENESIN ER 600 MG PO TB12
600.0000 mg | ORAL_TABLET | Freq: Two times a day (BID) | ORAL | Status: DC
  Administered 2016-08-12 – 2016-08-14 (×5): 600 mg via ORAL
  Filled 2016-08-12 (×5): qty 1

## 2016-08-12 MED ORDER — OXYCODONE HCL 5 MG PO TABS: 5.0000 mg | ORAL_TABLET | ORAL | Status: DC | PRN

## 2016-08-12 MED ORDER — MOMETASONE FURO-FORMOTEROL FUM 200-5 MCG/ACT IN AERO
2.0000 | INHALATION_SPRAY | Freq: Two times a day (BID) | RESPIRATORY_TRACT | Status: DC
  Administered 2016-08-12 – 2016-08-14 (×4): 2 via RESPIRATORY_TRACT
  Filled 2016-08-12: qty 8.8

## 2016-08-12 MED ORDER — IPRATROPIUM-ALBUTEROL 0.5-2.5 (3) MG/3ML IN SOLN
3.0000 mL | Freq: Three times a day (TID) | RESPIRATORY_TRACT | Status: DC
  Administered 2016-08-13 – 2016-08-14 (×4): 3 mL via RESPIRATORY_TRACT
  Filled 2016-08-12 (×4): qty 3

## 2016-08-12 MED ORDER — POTASSIUM CHLORIDE IN NACL 20-0.9 MEQ/L-% IV SOLN
INTRAVENOUS | Status: AC
  Administered 2016-08-12 – 2016-08-13 (×2): via INTRAVENOUS

## 2016-08-12 MED ORDER — ACETAMINOPHEN 650 MG RE SUPP: 650.0000 mg | Freq: Four times a day (QID) | RECTAL | Status: DC | PRN

## 2016-08-12 MED ORDER — BENZONATATE 100 MG PO CAPS: 100.0000 mg | ORAL_CAPSULE | Freq: Three times a day (TID) | ORAL | Status: DC | PRN

## 2016-08-12 MED ORDER — ALBUTEROL SULFATE (2.5 MG/3ML) 0.083% IN NEBU: 2.5000 mg | INHALATION_SOLUTION | RESPIRATORY_TRACT | Status: DC | PRN

## 2016-08-12 MED ORDER — METHYLPREDNISOLONE SODIUM SUCC 125 MG IJ SOLR
125.0000 mg | Freq: Once | INTRAMUSCULAR | Status: AC
  Administered 2016-08-12: 125 mg via INTRAVENOUS
  Filled 2016-08-12: qty 2

## 2016-08-12 MED ORDER — VITAMIN E 45 MG (100 UNIT) PO CAPS
1000.0000 [IU] | ORAL_CAPSULE | Freq: Every day | ORAL | Status: DC
  Administered 2016-08-13 – 2016-08-14 (×2): 1000 [IU] via ORAL
  Filled 2016-08-12 (×4): qty 2

## 2016-08-12 MED ORDER — ACETAMINOPHEN 325 MG PO TABS: 650.0000 mg | ORAL_TABLET | Freq: Four times a day (QID) | ORAL | Status: DC | PRN

## 2016-08-12 MED ORDER — INSULIN ASPART 100 UNIT/ML ~~LOC~~ SOLN
0.0000 [IU] | Freq: Every day | SUBCUTANEOUS | Status: DC
  Administered 2016-08-12: 5 [IU] via SUBCUTANEOUS
  Administered 2016-08-13: 3 [IU] via SUBCUTANEOUS

## 2016-08-12 MED ORDER — ENOXAPARIN SODIUM 40 MG/0.4ML ~~LOC~~ SOLN
40.0000 mg | SUBCUTANEOUS | Status: DC
  Administered 2016-08-12 – 2016-08-13 (×2): 40 mg via SUBCUTANEOUS
  Filled 2016-08-12 (×2): qty 0.4

## 2016-08-12 MED ORDER — VITAMIN B-12 100 MCG PO TABS
100.0000 ug | ORAL_TABLET | Freq: Every day | ORAL | Status: DC
  Administered 2016-08-13 – 2016-08-14 (×2): 100 ug via ORAL
  Filled 2016-08-12 (×2): qty 1

## 2016-08-12 MED ORDER — ASPIRIN 81 MG PO CHEW
81.0000 mg | CHEWABLE_TABLET | Freq: Every day | ORAL | Status: DC
  Administered 2016-08-13 – 2016-08-14 (×2): 81 mg via ORAL
  Filled 2016-08-12 (×2): qty 1

## 2016-08-12 MED ORDER — BENZONATATE 100 MG PO CAPS
100.0000 mg | ORAL_CAPSULE | Freq: Three times a day (TID) | ORAL | Status: DC
  Administered 2016-08-12 – 2016-08-14 (×6): 100 mg via ORAL
  Filled 2016-08-12 (×6): qty 1

## 2016-08-12 MED ORDER — ADULT MULTIVITAMIN W/MINERALS CH
ORAL_TABLET | Freq: Every day | ORAL | Status: DC
  Administered 2016-08-13 – 2016-08-14 (×2): 1 via ORAL
  Filled 2016-08-12 (×2): qty 1

## 2016-08-12 MED ORDER — OMEGA-3-ACID ETHYL ESTERS 1 G PO CAPS
1.0000 g | ORAL_CAPSULE | Freq: Every day | ORAL | Status: DC
  Administered 2016-08-13 – 2016-08-14 (×2): 1 g via ORAL
  Filled 2016-08-12 (×2): qty 1

## 2016-08-12 MED ORDER — INSULIN ASPART 100 UNIT/ML ~~LOC~~ SOLN
0.0000 [IU] | Freq: Three times a day (TID) | SUBCUTANEOUS | Status: DC
  Administered 2016-08-12 (×2): 20 [IU] via SUBCUTANEOUS
  Administered 2016-08-13 (×2): 15 [IU] via SUBCUTANEOUS
  Administered 2016-08-14: 7 [IU] via SUBCUTANEOUS

## 2016-08-12 MED ORDER — ALBUTEROL (5 MG/ML) CONTINUOUS INHALATION SOLN
10.0000 mg/h | INHALATION_SOLUTION | RESPIRATORY_TRACT | Status: DC
  Administered 2016-08-12: 10 mg/h via RESPIRATORY_TRACT
  Filled 2016-08-12: qty 20

## 2016-08-12 MED ORDER — ONDANSETRON HCL 4 MG PO TABS: 4.0000 mg | ORAL_TABLET | Freq: Four times a day (QID) | ORAL | Status: DC | PRN

## 2016-08-12 MED ORDER — ONDANSETRON HCL 4 MG/2ML IJ SOLN: 4.0000 mg | Freq: Four times a day (QID) | INTRAMUSCULAR | Status: DC | PRN

## 2016-08-12 MED ORDER — METHYLPREDNISOLONE SODIUM SUCC 125 MG IJ SOLR
80.0000 mg | Freq: Two times a day (BID) | INTRAMUSCULAR | Status: DC
  Administered 2016-08-12: 80 mg via INTRAVENOUS
  Filled 2016-08-12 (×2): qty 2

## 2016-08-12 NOTE — ED Provider Notes (Signed)
[facility] DEPT Provider Note   CSN: [id number] Arrival date & time: 08/12/16  0754     History   Chief Complaint Chief Complaint  Patient presents with  . Shortness of Breath    HPI Jeremy Leon is a 83 y.o. male.  HPI  The patient is an 83 year old male, he is a known history of COPD, uses spur Riva and inhalers as needed for shortness of breath but has never been admitted to the hospital or been on recent prednisone. He has no history of cardiac disease, no stenting, does not take any medication for blood pressure or diabetes and is otherwise healthy. He reports that he was around his family member who had a sore throat last week, a couple of days after this he developed a sore throat as well and then started to cough. This cough is been dry and hacking but today started to have some phlegm. He has been very short of breath, his symptoms became very severe today prompting his visit to the emergency department. He denies fevers or chills or swelling of the legs.  Past Medical History:  Diagnosis Date  . COPD (chronic obstructive pulmonary disease) (HCC)   . Decreased hearing of both ears   . History of cardiac catheterization    No significant CAD 2007    There are no active problems to display for this patient.   Past Surgical History:  Procedure Laterality Date  . TONSILLECTOMY    . VASECTOMY         Home Medications    Prior to Admission medications   Medication Sig Start Date End Date Taking? Authorizing Provider  aspirin 81 MG tablet Take 81 mg by mouth daily.    [provider]  cyanocobalamin 1000 MCG tablet Take 100 mcg by mouth daily.    [provider]  Omega-3 Fatty Acids (FISH OIL) 1000 MG CPDR Take 1,000 mg by mouth.    [provider]  tiotropium (SPIRIVA) 18 MCG inhalation capsule Place 18 mcg into inhaler and inhale daily.    [provider]  Vitamin D-Vitamin K (D3 + K2 DOTS) 1000-90 UNIT-MCG TABS Take by  mouth.    [provider]    Family History Family History  Problem Relation Age of Onset  . Colon cancer Father        Died age 81  . Stroke Mother   . Leukemia Brother        Died age 81  . Diabetes Mellitus II Brother     Social History Social History  Substance Use Topics  . Smoking status: Former Smoker    Types: Cigarettes    Quit date: 02/11/1997  . Smokeless tobacco: Never Used  . Alcohol use No     Allergies   Amoxicillin   Review of Systems Review of Systems  All other systems reviewed and are negative.    Physical Exam Updated Vital Signs BP (!) 149/82 (BP Location: Left Arm)   Pulse 97   Temp 98.5 F (36.9 C) (Oral)   Resp (!) 24   Ht 5' 10.5" (1.791 m)   Wt 95.3 kg (210 lb)   SpO2 (!) 89%   BMI 29.71 kg/m   Physical Exam  Constitutional: He appears well-developed and well-nourished. He appears distressed.  HENT:  Head: Normocephalic and atraumatic.  Mouth/Throat: No oropharyngeal exudate.  Dry mucous membranes  Eyes: Conjunctivae and EOM are normal. Pupils are equal, round, and reactive to light. Right eye exhibits  no discharge. Left eye exhibits no discharge. No scleral icterus.  Neck: Normal range of motion. Neck supple. No JVD present. No thyromegaly present.  Cardiovascular: Normal rate, regular rhythm, normal heart sounds and intact distal pulses.  Exam reveals no gallop and no friction rub.   No murmur heard. Pulmonary/Chest: He is in respiratory distress. He has wheezes ( Diminished breath sounds bilaterally with end expiratory wheezing). He has no rales.  Abdominal: Soft. Bowel sounds are normal. He exhibits no distension and no mass. There is no tenderness.  Musculoskeletal: Normal range of motion. He exhibits no edema or tenderness.  Lymphadenopathy:    He has no cervical adenopathy.  Neurological: He is alert. Coordination normal.  Skin: Skin is warm and dry. No rash noted. No erythema.  Psychiatric: He has a normal mood  and affect. His behavior is normal.  Nursing note and vitals reviewed.    ED Treatments / Results  Labs (all labs ordered are listed, but only abnormal results are displayed) Labs Reviewed  CBC WITH DIFFERENTIAL/PLATELET  BASIC METABOLIC PANEL  BRAIN NATRIURETIC PEPTIDE  TROPONIN I    EKG  EKG Interpretation  Date/Time:  Monday August 12 2016 08:03:46 EDT Ventricular Rate:  95 PR Interval:    QRS Duration: 105 QT Interval:  352 QTC Calculation: 443 R Axis:   -43 Text Interpretation:  Sinus rhythm Atrial premature complex Left axis deviation Abnormal R-wave progression, early transition since last tracing no significant change Confirmed by Eber Hong ([id number]) on 08/12/2016 8:11:16 AM        Radiology No results found.  Procedures Procedures (including critical care time)  Medications Ordered in ED Medications  albuterol (PROVENTIL,VENTOLIN) solution continuous neb (not administered)  methylPREDNISolone sodium succinate (SOLU-MEDROL) 125 mg/2 mL injection 125 mg (not administered)     Initial Impression / Assessment and Plan / ED Course  I have reviewed the triage vital signs and the nursing notes.  Pertinent labs & imaging results that were available during my care of the patient were reviewed by me and considered in my medical decision making (see chart for details).  Clinical Course as of Aug 15 737  Mon Aug 12, 2016  0913 Labs reviewed and show a white blood count of 14,000, neutrophil predominance, electrolytes show hyperglycemia with no anion gap acidosis, slightly low sodium consistent with hyperglycemia. Troponin and BNP are unremarkable. I had a long discussion with the patient regarding the treatment of his illness including the treatment for possible pneumonia given his x-ray showing some atelectasis, scarring or mild infiltrates at the bases. He is agreeable and is aware of the side effects of Levaquin. He is looking better on oxygen and on nebulized  treatments and is now speaking in full sentences and looking more comfortable. We will also start antidiabetic medications for the patient. I will discuss this with his family doctor, Dr. [name] who has been paged at 9:10 AM  [BM]  1031 Reevaluated after ambulation - dyspneic and hypoxic dropping to 84% on ambulation - discussed with Dr. Sherrie Mustache who requests ABG to assist with placement in step down vs tele.  [BM]    Clinical Course User Index [BM] Eber Hong, MD    COPD exacerbation Sat's 85% on RA Needs supplemental O2, Needs Nebs Needs CXR to r/o pna Solumedrol and recheck Cardiac monitoring,  Doubt CHF given no edema, on lungs, legs or JVD.  Pressure is slightly elevated Pt concerned abouth possibl hyperglycemia as well.  Likely severe COPD, still having significnat hypoxia  after nebs - CC provided, admitted to hospitalist.  CRITICAL CARE Performed by: Vida Roller Total critical care time: 35 minutes Critical care time was exclusive of separately billable procedures and treating other patients. Critical care was necessary to treat or prevent imminent or life-threatening deterioration. Critical care was time spent personally by me on the following activities: development of treatment plan with patient and/or surrogate as well as nursing, discussions with consultants, evaluation of patient's response to treatment, examination of patient, obtaining history from patient or surrogate, ordering and performing treatments and interventions, ordering and review of laboratory studies, ordering and review of radiographic studies, pulse oximetry and re-evaluation of patient's condition.   Final Clinical Impressions(s) / ED Diagnoses   Final diagnoses:  COPD exacerbation (HCC)  Respiratory distress  New Prescriptions New Prescriptions   No medications on file     Eber Hong, MD 08/15/16 [phone]

## 2016-08-12 NOTE — ED Triage Notes (Addendum)
PT states SOB with chest tightness that started last week with a cough and sore throat. PT short of breath on exertion with lung sounds clear. PT 89% on room air. PT c/o decreased appetite and generalized malaise.

## 2016-08-12 NOTE — ED Notes (Signed)
RT at bedside.

## 2016-08-12 NOTE — H&P (Signed)
History and Physical    Jeremy Leon:[id number] DOB: 19-Jun-1932 DOA: 08/12/2016  PCP: Kari Baars, MD  Patient coming from: home  I have personally briefly reviewed patient's old medical records in [facility]  Chief Complaint: shortness of breath  HPI: Jeremy Leon is a 83 y.o. male with medical history significant for COPD-not on oxygen-but treated with Spiriva and Advair and borderline diabetes,who presents with a several-day history of progressive shortness of breath. His symptoms started several days ago. It began with a sore throat and a nonproductive cough after being exposed to a family member that recently had URI symptoms. His shortness of breath is at rest, but worse with activity.he denies orthopnea or PND type symptoms. He has had an intermittently dry and productive cough with off-colored grayish sputum. He has chest wall pain when he coughs, otherwise he denies angina- type chest pain, pleurisy, fever, chills, nausea, vomiting, abdominal pain, or unusual leg swelling. He denies ear ache. His sore throat has resolved.  ED Course: In the ED, his respiratory rate was in the upper 20s. He was oxygenating 89%-94% on 2 L oxygen, mildly tachycardic, and normotensive. His ABG on room air: 7.06/10/59. Other lab data were remarkable for a WBC of 14.2, sodium of 133, glucose of 293, and BNP of 108. His chest x-ray revealed scarring in the lung bases without edema or consolidation; aortic arthrosclerosis. He was admitted for COPD exacerbation.  Review of Systems: As per HPI otherwise 10 point review of systems negative.    Past Medical History:  Diagnosis Date  . Borderline diabetes   . COPD (chronic obstructive pulmonary disease) (HCC)   . Decreased hearing of both ears   . History of cardiac catheterization    No significant CAD 2007    Past Surgical History:  Procedure Laterality Date  . TONSILLECTOMY    . VASECTOMY      Social History-He is married. He  reports that he quit smoking about 19 years ago. His smoking use included Cigarettes. He has never used smokeless tobacco. He reports that he does not drink alcohol or use drugs.  Allergies  Allergen Reactions  . Amoxicillin     Itching     Family History  Problem Relation Age of Onset  . Colon cancer Father        Died age 39  . Stroke Mother   . Leukemia Brother        Died age 59  . Diabetes Mellitus II Brother      Prior to Admission medications   Medication Sig Start Date End Date Taking? Authorizing Provider  aspirin 81 MG tablet Take 81 mg by mouth daily.   Yes [provider]  cyanocobalamin 1000 MCG tablet Take 100 mcg by mouth daily.   Yes [provider]  Fluticasone-Salmeterol (ADVAIR) 250-50 MCG/DOSE AEPB Inhale 1 puff into the lungs 2 (two) times daily.   Yes [provider]  Magnesium 250 MG TABS Take 1 tablet by mouth daily.   Yes [provider]  Multiple Vitamins-Minerals (CENTRUM SILVER 50+MEN PO) Take 1 tablet by mouth daily.   Yes [provider]  Omega-3 Fatty Acids (FISH OIL) 1000 MG CPDR Take 1,000 mg by mouth.   Yes [provider]  tiotropium (SPIRIVA) 18 MCG inhalation capsule Place 18 mcg into inhaler and inhale daily.   Yes [provider]  Vitamin D-Vitamin K (D3 + K2 DOTS) 1000-90 UNIT-MCG TABS Take by mouth.   Yes [provider]  vitamin E 1000 UNIT capsule Take 1,000 Units by mouth daily.   Yes [provider]    Physical Exam: Vitals:   08/12/16 1030 08/12/16 1130 08/12/16 1230 08/12/16 1324  BP: 131/68 124/84 (!) 107/96 131/66  Pulse: 98 92 95 86  Resp: (!) 33 (!) 22 (!) 25 18  Temp:    97.6 F (36.4 C)  TempSrc:    Oral  SpO2: (!) 89% 90% 91% 93%  Weight:    86.5 kg (190 lb 9.6 oz)  Height:    5\' 10"  (1.778 m)    Constitutional: NAD, calm, comfortable; status post continuous neb and Solu-Medrol. Vitals:   08/12/16 1030 08/12/16 1130 08/12/16 1230  08/12/16 1324  BP: 131/68 124/84 (!) 107/96 131/66  Pulse: 98 92 95 86  Resp: (!) 33 (!) 22 (!) 25 18  Temp:    97.6 F (36.4 C)  TempSrc:    Oral  SpO2: (!) 89% 90% 91% 93%  Weight:    86.5 kg (190 lb 9.6 oz)  Height:    5\' 10"  (1.778 m)   Eyes: PERRL, lids and conjunctivae normal ENMT: Mucous membranes are mildly dry. Posterior pharynx clear of any exudate or lesions.Normal dentition.  Neck: normal, supple, no masses, no thyromegaly Respiratory: decreased breath sounds globally, but with a few breakthrough wheezes and crackles. Normal respiratory effort. No accessory muscle use.  Cardiovascular: S1, S2, with occasional ectopy. No extremity edema. 2+ pedal pulses. No carotid bruits.  Abdomen: no tenderness, no masses palpated. No hepatosplenomegaly. Bowel sounds positive.  Musculoskeletal: no clubbing / cyanosis. No joint deformity upper and lower extremities. Good ROM, no contractures. Normal muscle tone.  Skin: no rashes, lesions, ulcers. No induration Neurologic: CN 2-12 grossly intact. Sensation intact, DTR normal. Strength 5/5 in all 4.  Psychiatric: Normal judgment and insight. Alert and oriented x 3. Normal mood.    Labs on Admission: I have personally reviewed following labs and imaging studies  CBC:  Recent Labs Lab 08/12/16 0808  WBC 14.2*  NEUTROABS 11.1*  HGB 14.1  HCT 40.1  MCV 86.6  PLT 209   Basic Metabolic Panel:  Recent Labs Lab 08/12/16 0808  NA 133*  K 3.8  CL 100*  CO2 21*  GLUCOSE 293*  BUN 19  CREATININE 0.99  CALCIUM 8.7*   GFR: Estimated Creatinine Clearance: 59.4 mL/min (by C-G formula based on SCr of 0.99 mg/dL). Liver Function Tests: No results for input(s): AST, ALT, ALKPHOS, BILITOT, PROT, ALBUMIN in the last 168 hours. No results for input(s): LIPASE, AMYLASE in the last 168 hours. No results for input(s): AMMONIA in the last 168 hours. Coagulation Profile: No results for input(s): INR, PROTIME in the last 168 hours. Cardiac  Enzymes:  Recent Labs Lab 08/12/16 0808  TROPONINI <0.03   BNP (last 3 results) No results for input(s): PROBNP in the last 8760 hours. HbA1C: No results for input(s): HGBA1C in the last 72 hours. CBG: No results for input(s): GLUCAP in the last 168 hours. Lipid Profile: No results for input(s): CHOL, HDL, LDLCALC, TRIG, CHOLHDL, LDLDIRECT in the last 72 hours. Thyroid Function Tests: No results for input(s): TSH, T4TOTAL, FREET4, T3FREE, THYROIDAB in the last 72 hours. Anemia Panel: No results for input(s): VITAMINB12, FOLATE, FERRITIN, TIBC, IRON, RETICCTPCT in the last 72 hours. Urine analysis: No results found for: COLORURINE, APPEARANCEUR, LABSPEC, PHURINE, GLUCOSEU, HGBUR, BILIRUBINUR, KETONESUR, PROTEINUR, UROBILINOGEN, NITRITE, LEUKOCYTESUR  Radiological Exams on Admission: Dg Chest 2 View  Result Date: 08/12/2016  CLINICAL DATA:  Shortness of breath with cough and congestion EXAM: CHEST  2 VIEW COMPARISON:  May 12, 2009 FINDINGS: There is an apparent nipple shadow on the left. There is bibasilar lung scarring. There is no appreciable edema or consolidation. Heart size is within normal limits. Pulmonary vascularity is within normal limits and stable. There is aortic atherosclerosis. No adenopathy. There is degenerative change in the thoracic spine. IMPRESSION: Scarring in the lung bases. No edema or consolidation. Stable cardiac silhouette. There is aortic atherosclerosis. Aortic Atherosclerosis (ICD10-I70.0). Electronically Signed   By: Bretta Bang III M.D.   On: 08/12/2016 08:33    EKG: Independently reviewed. HR 95, PACs  Assessment/Plan Principal Problem:   COPD with acute exacerbation (HCC) Active Problems:   Acute respiratory failure with hypoxia (HCC)   COPD (chronic obstructive pulmonary disease) (HCC)   Type 2 diabetes mellitus without complication (HCC)   Hyponatremia    Patient is a pleasant 83 year old with a history of COPD, treated with Spiriva and  Advair, who presents in some respiratory distress and hypoxia secondary to COPD with exacerbation. His symptomatology was probably triggered by a recent short-lived URI after being exposed to a family member with similar symptoms. There is no evidence of heart failure and the chest x-ray did not reveal an obvious infiltrate. His sodium is slightly low, likely from mild volume depletion. His venous glucose is 293. He was given a diagnosis of borderline diabetes several years ago. It is likely that he now has type 2 diabetes, exacerbated by IV steroids in the ED.  1. The patient was given a continuous Neb, 125 mg IV Solu-Medrol, oxygen, and IV Levaquin in the ED. 2. We'll continue treatment with oxygen titrated to keep his O2 sats greater than 92% IV Solu-Medrol 80 mg every 12 hours, DuoNebs and Levaquin. Will continue Advair. 3. For symptomatic relief, will add Tessalon Perles 3 times daily and guaifenesin every 12 hours. 4. Will start sliding scale NovoLog and Lantus in the setting of IV steroid treatment. Will order hemoglobin A1c for assessment. 5. Will start gentle IV fluids 24 hours. Follow-up on his serum sodium.  DVT prophylaxis: Lovenox Code Status: full code Family Communication: discussed with wife Disposition Plan:discharge to home in 2-3 days or when clinically appropriate Consults called: none Admission status: inpatient telemetry   [facility] MD [facility] Pager [phone]  If 7PM-7AM, please contact night-coverage [email or web address] Password [email or web address]  08/12/2016, 2:25 PM

## 2016-08-12 NOTE — ED Notes (Signed)
Pt verbalizes he is coughing up grey phlegm, wanted to make this nurse aware.

## 2016-08-12 NOTE — ED Notes (Signed)
Pt ambulated. Oxygen saturation was 85% on RA. Pt became dyspneic after circling the nurses desk.

## 2016-08-13 DIAGNOSIS — J9601 Acute respiratory failure with hypoxia: Secondary | ICD-10-CM | POA: Diagnosis not present

## 2016-08-13 DIAGNOSIS — J441 Chronic obstructive pulmonary disease with (acute) exacerbation: Secondary | ICD-10-CM | POA: Diagnosis not present

## 2016-08-13 DIAGNOSIS — E119 Type 2 diabetes mellitus without complications: Secondary | ICD-10-CM | POA: Diagnosis not present

## 2016-08-13 LAB — COMPREHENSIVE METABOLIC PANEL
ALBUMIN: 2.7 g/dL — AB (ref 3.5–5.0)
ALT: 20 U/L (ref 17–63)
ANION GAP: 8 (ref 5–15)
AST: 16 U/L (ref 15–41)
Alkaline Phosphatase: 68 U/L (ref 38–126)
BUN: 26 mg/dL — ABNORMAL HIGH (ref 6–20)
CHLORIDE: 103 mmol/L (ref 101–111)
CO2: 23 mmol/L (ref 22–32)
CREATININE: 0.96 mg/dL (ref 0.61–1.24)
Calcium: 8.3 mg/dL — ABNORMAL LOW (ref 8.9–10.3)
GFR calc non Af Amer: >60 mL/min (ref >60)
GLUCOSE: 333 mg/dL — AB (ref 65–99)
Potassium: 4 mmol/L (ref 3.5–5.1)
SODIUM: 134 mmol/L — AB (ref 135–145)
Total Bilirubin: 0.7 mg/dL (ref 0.3–1.2)
Total Protein: 6 g/dL — ABNORMAL LOW (ref 6.5–8.1)

## 2016-08-13 LAB — GLUCOSE, CAPILLARY
GLUCOSE-CAPILLARY: 316 mg/dL — AB (ref 65–99)
GLUCOSE-CAPILLARY: 412 mg/dL — AB (ref 65–99)
GLUCOSE-CAPILLARY: 292 mg/dL — AB (ref 65–99)
Glucose-Capillary: 348 mg/dL — ABNORMAL HIGH (ref 65–99)

## 2016-08-13 LAB — CBC
HCT: 36.3 % — ABNORMAL LOW (ref 39.0–52.0)
Hemoglobin: 12.4 g/dL — ABNORMAL LOW (ref 13.0–17.0)
MCH: 29.6 pg (ref 26.0–34.0)
MCHC: 34.2 g/dL (ref 30.0–36.0)
MCV: 86.6 fL (ref 78.0–100.0)
PLATELETS: 196 10*3/uL (ref 150–400)
RBC: 4.19 MIL/uL — AB (ref 4.22–5.81)
RDW: 12.9 % (ref 11.5–15.5)
WBC: 14.1 10*3/uL — ABNORMAL HIGH (ref 4.0–10.5)

## 2016-08-13 LAB — HEMOGLOBIN A1C
HEMOGLOBIN A1C: 10.2 % — AB (ref 4.8–5.6)
MEAN PLASMA GLUCOSE: 246 mg/dL

## 2016-08-13 LAB — GLUCOSE, RANDOM: GLUCOSE: 408 mg/dL — AB (ref 65–99)

## 2016-08-13 MED ORDER — INSULIN STARTER KIT- SYRINGES (ENGLISH)
1.0000 | Freq: Once | Status: AC
  Administered 2016-08-13: 1
  Filled 2016-08-13: qty 1

## 2016-08-13 MED ORDER — PREDNISONE 20 MG PO TABS
40.0000 mg | ORAL_TABLET | Freq: Every day | ORAL | Status: DC
  Administered 2016-08-13 – 2016-08-14 (×2): 40 mg via ORAL
  Filled 2016-08-13 (×2): qty 2

## 2016-08-13 MED ORDER — INSULIN ASPART 100 UNIT/ML ~~LOC~~ SOLN
25.0000 [IU] | Freq: Once | SUBCUTANEOUS | Status: AC
  Administered 2016-08-13: 25 [IU] via SUBCUTANEOUS

## 2016-08-13 MED ORDER — LEVOFLOXACIN 500 MG PO TABS
500.0000 mg | ORAL_TABLET | Freq: Every day | ORAL | Status: DC
  Administered 2016-08-13 – 2016-08-14 (×2): 500 mg via ORAL
  Filled 2016-08-13 (×2): qty 1

## 2016-08-13 NOTE — Progress Notes (Signed)
Lunch time CBG is 412. Ordered STAT glucose by lab. Glucose is 408. TC made to Dr. Juanetta GoslingHawkins who ordered 25 units novolog insulin x1 instead of lunch time sliding scale.

## 2016-08-13 NOTE — Progress Notes (Signed)
Pt educated regarding drawing up insulin and self injection. Pt was able to draw up insulin and self inject lunch and dinner insulin without difficulty.

## 2016-08-13 NOTE — Progress Notes (Signed)
Inpatient Diabetes Program Recommendations  AACE/ADA: New Consensus Statement on Inpatient Glycemic Control (2015)  Target Ranges:  Prepandial:   less than 140 mg/dL      Peak postprandial:   less than 180 mg/dL (1-2 hours)      Critically ill patients:  140 - 180 mg/dL   Results for Jeremy Leon, Price M (MRN [id number]) as of 08/13/2016 07:28  Ref. Range 08/12/2016 08:17  Hemoglobin A1C Latest Ref Range: 4.8 - 5.6 % 10.2 (H)   Results for Jeremy Leon, [name] (MRN [id number]) as of 08/13/2016 07:28  Ref. Range 08/12/2016 15:08 08/12/2016 16:25 08/12/2016 20:58  Glucose-Capillary Latest Ref Range: 65 - 99 mg/dL 086397 (H) 578387 (H) 469352 (H)   Results for Jeremy Leon, [name] (MRN [id number]) as of 08/13/2016 07:57  Ref. Range 08/13/2016 07:45  Glucose-Capillary Latest Ref Range: 65 - 99 mg/dL 244316 (H)    Admit with: SOB  History: Borderline DM  Home DM Meds: None  Current Insulin Orders: Lantus 10 units BID      Novolog Resistant Correction Scale/ SSI (0-20 units) TID AC + HS      MD- Note patient with history of Borderline Diabetes.  Current Hemoglobin A1c significantly elevated to 10.2% (average glucose 246 mg/dl for last 3 months).    Please address current A1c with patient.  Is this a new diagnosis for patient?  If so, will having nursing staff begin basic Diabetes survival skills education with patient and his wife.  Note CBGs significantly elevated last PM.  Also note that Solumedrol stopped this AM and patient switched to Prednisone 40 mg daily.  This should help reduce glucose levels somewhat.     --Will follow patient during hospitalization--  Ambrose FinlandJeannine Johnston Bettye Sitton RN, MSN, CDE Diabetes Coordinator Inpatient Glycemic Control Team Team Pager: [phone] (8a-5p)

## 2016-08-13 NOTE — Progress Notes (Signed)
Subjective: He was admitted yesterday with COPD exacerbation and uncontrolled diabetes. He says he feels better. His blood sugar has been quite elevated but he is on IV steroids. I have not seen him in my office in at least a year and according to the patient and his wife he's been having some trouble with his blood sugar being up in the last time that I saw him I think he was prediabetic. His wife has diabetes on insulin so she feels confident that she can give him injections when he goes home which I think he will probably need at least to start. He may be able to be adjusted to oral medications depending on how he does. He says his breathing is pretty well back to baseline now.  Objective: Vital signs in last 24 hours: Temp:  [97.6 F (36.4 C)-97.7 F (36.5 C)] 97.6 F (36.4 C) (07/03 0622) Pulse Rate:  [60-103] 60 (07/03 0622) Resp:  [14-33] 14 (07/03 0622) BP: (100-131)/(57-96) 100/57 (07/03 0622) SpO2:  [89 %-96 %] 93 % (07/03 0730) Weight:  [86.5 kg (190 lb 9.6 oz)] 86.5 kg (190 lb 9.6 oz) (07/02 1324) Weight change:  Last BM Date: 08/12/16  Intake/Output from previous day: 07/02 0701 - 07/03 0700 In: 1540 [P.O.:480; I.V.:910; IV Piggyback:150] Out: 650 [Urine:650]  PHYSICAL EXAM General appearance: alert, cooperative and mild distress Resp: Clear with diminished breath sounds Cardio: regular rate and rhythm, S1, S2 normal, no murmur, click, rub or gallop GI: soft, non-tender; bowel sounds normal; no masses,  no organomegaly Extremities: extremities normal, atraumatic, no cyanosis or edema Skin warm and dry. Mucous membranes are moist  Lab Results:  Results for orders placed or performed during the hospital encounter of 08/12/16 (from the past 48 hour(s))  CBC with Differential/Platelet     Status: Abnormal   Collection Time: 08/12/16  8:08 AM  Result Value Ref Range   WBC 14.2 (H) 4.0 - 10.5 K/uL   RBC 4.63 4.22 - 5.81 MIL/uL   Hemoglobin 14.1 13.0 - 17.0 g/dL   HCT  40.1 39.0 - 52.0 %   MCV 86.6 78.0 - 100.0 fL   MCH 30.5 26.0 - 34.0 pg   MCHC 35.2 30.0 - 36.0 g/dL   RDW 12.8 11.5 - 15.5 %   Platelets 209 150 - 400 K/uL   Neutrophils Relative % 78 %   Neutro Abs 11.1 (H) 1.7 - 7.7 K/uL   Lymphocytes Relative 12 %   Lymphs Abs 1.7 0.7 - 4.0 K/uL   Monocytes Relative 10 %   Monocytes Absolute 1.4 (H) 0.1 - 1.0 K/uL   Eosinophils Relative 0 %   Eosinophils Absolute 0.0 0.0 - 0.7 K/uL   Basophils Relative 0 %   Basophils Absolute 0.0 0.0 - 0.1 K/uL  Basic metabolic panel     Status: Abnormal   Collection Time: 08/12/16  8:08 AM  Result Value Ref Range   Sodium 133 (L) 135 - 145 mmol/L   Potassium 3.8 3.5 - 5.1 mmol/L   Chloride 100 (L) 101 - 111 mmol/L   CO2 21 (L) 22 - 32 mmol/L   Glucose, Bld 293 (H) 65 - 99 mg/dL   BUN 19 6 - 20 mg/dL   Creatinine, Ser 0.99 0.61 - 1.24 mg/dL   Calcium 8.7 (L) 8.9 - 10.3 mg/dL   GFR calc non Af Amer >60 >60 mL/min   GFR calc Af Amer >60 >60 mL/min    Comment: (NOTE) The eGFR has been calculated using  the CKD EPI equation. This calculation has not been validated in all clinical situations. eGFR's persistently <60 mL/min signify possible Chronic Kidney Disease.    Anion gap 12 5 - 15  Brain natriuretic peptide     Status: Abnormal   Collection Time: 08/12/16  8:08 AM  Result Value Ref Range   B Natriuretic Peptide 108.0 (H) 0.0 - 100.0 pg/mL  Troponin I     Status: None   Collection Time: 08/12/16  8:08 AM  Result Value Ref Range   Troponin I <0.03 <0.03 ng/mL  Hemoglobin A1c     Status: Abnormal   Collection Time: 08/12/16  8:17 AM  Result Value Ref Range   Hgb A1c MFr Bld 10.2 (H) 4.8 - 5.6 %    Comment: (NOTE)         Pre-diabetes: 5.7 - 6.4         Diabetes: >6.4         Glycemic control for adults with diabetes: <7.0    Mean Plasma Glucose 246 mg/dL    Comment: (NOTE) Performed At: [facility] [place], [place] [id number] Lindon Romp MD [id number]   Blood  gas, arterial     Status: Abnormal   Collection Time: 08/12/16 11:20 AM  Result Value Ref Range   FIO2 0.21    O2 Content 21.0 L/min   Delivery systems ROOM AIR    pH, Arterial 7.414 7.350 - 7.450   pCO2 arterial 28.8 (L) 32.0 - 48.0 mmHg   pO2, Arterial 61.0 (L) 83.0 - 108.0 mmHg   Bicarbonate 20.6 20.0 - 28.0 mmol/L   Acid-base deficit 5.7 (H) 0.0 - 2.0 mmol/L   O2 Saturation 90.4 %   Collection site RIGHT RADIAL    Drawn by [phone]    Sample type ARTERIAL    Allens test (pass/fail) PASS PASS  Glucose, capillary     Status: Abnormal   Collection Time: 08/12/16  3:08 PM  Result Value Ref Range   Glucose-Capillary 397 (H) 65 - 99 mg/dL  Glucose, capillary     Status: Abnormal   Collection Time: 08/12/16  4:25 PM  Result Value Ref Range   Glucose-Capillary 387 (H) 65 - 99 mg/dL  Glucose, capillary     Status: Abnormal   Collection Time: 08/12/16  8:58 PM  Result Value Ref Range   Glucose-Capillary 352 (H) 65 - 99 mg/dL   Comment 1 Notify RN    Comment 2 Document in Chart   CBC     Status: Abnormal   Collection Time: 08/13/16  5:43 AM  Result Value Ref Range   WBC 14.1 (H) 4.0 - 10.5 K/uL   RBC 4.19 (L) 4.22 - 5.81 MIL/uL   Hemoglobin 12.4 (L) 13.0 - 17.0 g/dL   HCT 36.3 (L) 39.0 - 52.0 %   MCV 86.6 78.0 - 100.0 fL   MCH 29.6 26.0 - 34.0 pg   MCHC 34.2 30.0 - 36.0 g/dL   RDW 12.9 11.5 - 15.5 %   Platelets 196 150 - 400 K/uL  Comprehensive metabolic panel     Status: Abnormal   Collection Time: 08/13/16  5:43 AM  Result Value Ref Range   Sodium 134 (L) 135 - 145 mmol/L   Potassium 4.0 3.5 - 5.1 mmol/L   Chloride 103 101 - 111 mmol/L   CO2 23 22 - 32 mmol/L   Glucose, Bld 333 (H) 65 - 99 mg/dL   BUN 26 (H) 6 - 20 mg/dL  Creatinine, Ser 0.96 0.61 - 1.24 mg/dL   Calcium 8.3 (L) 8.9 - 10.3 mg/dL   Total Protein 6.0 (L) 6.5 - 8.1 g/dL   Albumin 2.7 (L) 3.5 - 5.0 g/dL   AST 16 15 - 41 U/L   ALT 20 17 - 63 U/L   Alkaline Phosphatase 68 38 - 126 U/L   Total Bilirubin 0.7  0.3 - 1.2 mg/dL   GFR calc non Af Amer >60 >60 mL/min   GFR calc Af Amer >60 >60 mL/min    Comment: (NOTE) The eGFR has been calculated using the CKD EPI equation. This calculation has not been validated in all clinical situations. eGFR's persistently <60 mL/min signify possible Chronic Kidney Disease.    Anion gap 8 5 - 15  Glucose, capillary     Status: Abnormal   Collection Time: 08/13/16  7:45 AM  Result Value Ref Range   Glucose-Capillary 316 (H) 65 - 99 mg/dL    ABGS  Recent Labs  08/12/16 1120  PHART 7.414  PO2ART 61.0*  HCO3 20.6   CULTURES No results found for this or any previous visit (from the past 240 hour(s)). Studies/Results: Dg Chest 2 View  Result Date: 08/12/2016 CLINICAL DATA:  Shortness of breath with cough and congestion EXAM: CHEST  2 VIEW COMPARISON:  May 12, 2009 FINDINGS: There is an apparent nipple shadow on the left. There is bibasilar lung scarring. There is no appreciable edema or consolidation. Heart size is within normal limits. Pulmonary vascularity is within normal limits and stable. There is aortic atherosclerosis. No adenopathy. There is degenerative change in the thoracic spine. IMPRESSION: Scarring in the lung bases. No edema or consolidation. Stable cardiac silhouette. There is aortic atherosclerosis. Aortic Atherosclerosis (ICD10-I70.0). Electronically Signed   By: Lowella Grip III M.D.   On: 08/12/2016 08:33    Medications:  Prior to Admission:  Prescriptions Prior to Admission  Medication Sig Dispense Refill Last Dose  . aspirin 81 MG tablet Take 81 mg by mouth daily.   08/12/2016 at Unknown time  . cyanocobalamin 1000 MCG tablet Take 100 mcg by mouth daily.   08/12/2016 at Unknown time  . Fluticasone-Salmeterol (ADVAIR) 250-50 MCG/DOSE AEPB Inhale 1 puff into the lungs 2 (two) times daily.   08/12/2016 at Unknown time  . Magnesium 250 MG TABS Take 1 tablet by mouth daily.   08/12/2016 at Unknown time  . Multiple Vitamins-Minerals  (CENTRUM SILVER 50+MEN PO) Take 1 tablet by mouth daily.   08/12/2016 at Unknown time  . Omega-3 Fatty Acids (FISH OIL) 1000 MG CPDR Take 1,000 mg by mouth.   08/12/2016 at Unknown time  . tiotropium (SPIRIVA) 18 MCG inhalation capsule Place 18 mcg into inhaler and inhale daily.   08/11/2016 at Unknown time  . Vitamin D-Vitamin K (D3 + K2 DOTS) 1000-90 UNIT-MCG TABS Take by mouth.   08/12/2016 at Unknown time  . vitamin E 1000 UNIT capsule Take 1,000 Units by mouth daily.   08/12/2016 at Unknown time   Scheduled: . aspirin  81 mg Oral Daily  . benzonatate  100 mg Oral TID  . docusate sodium  100 mg Oral BID  . enoxaparin (LOVENOX) injection  40 mg Subcutaneous Q24H  . guaiFENesin  600 mg Oral BID  . insulin aspart  0-20 Units Subcutaneous TID WC  . insulin aspart  0-5 Units Subcutaneous QHS  . insulin glargine  10 Units Subcutaneous BID  . ipratropium-albuterol  3 mL Nebulization TID  . levofloxacin  500  mg Oral Daily  . magnesium oxide  400 mg Oral Daily  . mometasone-formoterol  2 puff Inhalation BID  . multivitamin with minerals   Oral Daily  . omega-3 acid ethyl esters  1 g Oral Daily  . predniSONE  40 mg Oral Q breakfast  . cyanocobalamin  100 mcg Oral Daily  . vitamin E  1,000 Units Oral Daily   Continuous: . 0.9 % NaCl with KCl 20 mEq / L 75 mL/hr at 08/13/16 0335   MBP:[id number] **OR** acetaminophen, albuterol, ondansetron **OR** ondansetron (ZOFRAN) IV, oxyCODONE  Assesment: He is admitted with COPD exacerbation and acute hypoxic respiratory failure. He has diabetes and his blood sugar is elevated I think probably because of the steroids acute illness and he probably does have diabetes now. He is better but his blood sugar is uncontrolled Principal Problem:   COPD with acute exacerbation ([place]) Active Problems:   COPD (chronic obstructive pulmonary disease) (HCC)   Acute respiratory failure with hypoxia (HCC)   Type 2 diabetes mellitus without complication ([place])    Hyponatremia    Plan: Switch to oral Levaquin and prednisone. See how he does with his blood sugar. I'm not going to change his insulin yet until I see how he does off the IV steroids. Potential discharge tomorrow.    LOS: 1 day   Jeremy Leon L 08/13/2016, 8:30 AM

## 2016-08-13 NOTE — Care Management Note (Signed)
Case Management Note  Patient Details  Name: Jeremy Leon MRN: [id number] Date of Birth: 1932-07-31  Subjective/Objective:                  Admitted with COPD exacerbation. Pt from home with spouse. He is ind with ADL's. He drives himself to appointments. He has PCP. He has no difficulty affording or managing medications. He does not have neb machine pta. Pt requesting same inhaler he is using here be Rx at DC. He communicates no CM needs.   Action/Plan: DC home tomorrow with self care. CM will monitor for needs.   Expected Discharge Date:     08/14/2016             Expected Discharge Plan:  Home/Self Care  In-House Referral:  NA  Discharge planning Services  CM Consult  Post Acute Care Choice:  NA Choice offered to:  NA  Status of Service:  In process, will continue to follow  Malcolm MetroChildress, Kelvin Burpee Demske, RN 08/13/2016, 11:01 AM

## 2016-08-14 DIAGNOSIS — J9601 Acute respiratory failure with hypoxia: Secondary | ICD-10-CM | POA: Diagnosis not present

## 2016-08-14 DIAGNOSIS — J441 Chronic obstructive pulmonary disease with (acute) exacerbation: Secondary | ICD-10-CM | POA: Diagnosis not present

## 2016-08-14 DIAGNOSIS — E119 Type 2 diabetes mellitus without complications: Secondary | ICD-10-CM | POA: Diagnosis not present

## 2016-08-14 LAB — GLUCOSE, CAPILLARY: GLUCOSE-CAPILLARY: 218 mg/dL — AB (ref 65–99)

## 2016-08-14 MED ORDER — GUAIFENESIN ER 600 MG PO TB12: 600.0000 mg | ORAL_TABLET | Freq: Two times a day (BID) | ORAL | 1 refills | Status: DC

## 2016-08-14 MED ORDER — METFORMIN HCL 500 MG PO TABS
500.0000 mg | ORAL_TABLET | ORAL | Status: AC
  Administered 2016-08-14: 500 mg via ORAL
  Filled 2016-08-14: qty 1

## 2016-08-14 MED ORDER — LEVOFLOXACIN 500 MG PO TABS: 500.0000 mg | ORAL_TABLET | Freq: Every day | ORAL | 0 refills | Status: DC

## 2016-08-14 MED ORDER — METFORMIN HCL 500 MG PO TABS: 500.0000 mg | ORAL_TABLET | Freq: Two times a day (BID) | ORAL | 11 refills | Status: DC

## 2016-08-14 MED ORDER — PREDNISONE 10 MG (21) PO TBPK: ORAL_TABLET | ORAL | 0 refills | Status: DC

## 2016-08-14 NOTE — Progress Notes (Signed)
IV removed.  Catheter intact.  Site, clean dry and intact.  AVS provided to patient.  New meds discussed with patient. Pt verbalized understanding.   Educated on Diabetic diet.    Diabetic diet and carb counting handouts provided.   Teach back successful.    Patient wheeled downstairs by tech.   Jeremy Leon

## 2016-08-14 NOTE — Progress Notes (Signed)
Subjective: He says he feels much better and wants to go home. No new complaints. His blood sugars still up and down  Objective: Vital signs in last 24 hours: Temp:  [97.5 F (36.4 C)-97.7 F (36.5 C)] 97.6 F (36.4 C) (07/04 0448) Pulse Rate:  [69-89] 69 (07/04 0448) Resp:  [16] 16 (07/04 0448) BP: (102-128)/(55-75) 102/75 (07/04 0448) SpO2:  [93 %-95 %] 94 % (07/04 0739) Weight:  [94.1 kg (207 lb 6.4 oz)] 94.1 kg (207 lb 6.4 oz) (07/04 0448) Weight change: -1.179 kg (-2 lb 9.6 oz) Last BM Date: 08/12/16  Intake/Output from previous day: 07/03 0701 - 07/04 0700 In: 1200 [P.O.:1200] Out: 1135 [Urine:1135]  PHYSICAL EXAM General appearance: alert, cooperative and no distress Resp: clear to auscultation bilaterally Cardio: regular rate and rhythm, S1, S2 normal, no murmur, click, rub or gallop GI: soft, non-tender; bowel sounds normal; no masses,  no organomegaly Extremities: extremities normal, atraumatic, no cyanosis or edema Skin warm and dry. He is hard of hearing  Lab Results:  Results for orders placed or performed during the hospital encounter of 08/12/16 (from the past 48 hour(s))  Blood gas, arterial     Status: Abnormal   Collection Time: 08/12/16 11:20 AM  Result Value Ref Range   FIO2 0.21    O2 Content 21.0 L/min   Delivery systems ROOM AIR    pH, Arterial 7.414 7.350 - 7.450   pCO2 arterial 28.8 (L) 32.0 - 48.0 mmHg   pO2, Arterial 61.0 (L) 83.0 - 108.0 mmHg   Bicarbonate 20.6 20.0 - 28.0 mmol/L   Acid-base deficit 5.7 (H) 0.0 - 2.0 mmol/L   O2 Saturation 90.4 %   Collection site RIGHT RADIAL    Drawn by [phone]    Sample type ARTERIAL    Allens test (pass/fail) PASS PASS  Glucose, capillary     Status: Abnormal   Collection Time: 08/12/16  3:08 PM  Result Value Ref Range   Glucose-Capillary 397 (H) 65 - 99 mg/dL  Glucose, capillary     Status: Abnormal   Collection Time: 08/12/16  4:25 PM  Result Value Ref Range   Glucose-Capillary 387 (H) 65 - 99  mg/dL  Glucose, capillary     Status: Abnormal   Collection Time: 08/12/16  8:58 PM  Result Value Ref Range   Glucose-Capillary 352 (H) 65 - 99 mg/dL   Comment 1 Notify RN    Comment 2 Document in Chart   CBC     Status: Abnormal   Collection Time: 08/13/16  5:43 AM  Result Value Ref Range   WBC 14.1 (H) 4.0 - 10.5 K/uL   RBC 4.19 (L) 4.22 - 5.81 MIL/uL   Hemoglobin 12.4 (L) 13.0 - 17.0 g/dL   HCT 36.3 (L) 39.0 - 52.0 %   MCV 86.6 78.0 - 100.0 fL   MCH 29.6 26.0 - 34.0 pg   MCHC 34.2 30.0 - 36.0 g/dL   RDW 12.9 11.5 - 15.5 %   Platelets 196 150 - 400 K/uL  Comprehensive metabolic panel     Status: Abnormal   Collection Time: 08/13/16  5:43 AM  Result Value Ref Range   Sodium 134 (L) 135 - 145 mmol/L   Potassium 4.0 3.5 - 5.1 mmol/L   Chloride 103 101 - 111 mmol/L   CO2 23 22 - 32 mmol/L   Glucose, Bld 333 (H) 65 - 99 mg/dL   BUN 26 (H) 6 - 20 mg/dL   Creatinine, Ser 0.96 0.61 -  1.24 mg/dL   Calcium 8.3 (L) 8.9 - 10.3 mg/dL   Total Protein 6.0 (L) 6.5 - 8.1 g/dL   Albumin 2.7 (L) 3.5 - 5.0 g/dL   AST 16 15 - 41 U/L   ALT 20 17 - 63 U/L   Alkaline Phosphatase 68 38 - 126 U/L   Total Bilirubin 0.7 0.3 - 1.2 mg/dL   GFR calc non Af Amer >60 >60 mL/min   GFR calc Af Amer >60 >60 mL/min    Comment: (NOTE) The eGFR has been calculated using the CKD EPI equation. This calculation has not been validated in all clinical situations. eGFR's persistently <60 mL/min signify possible Chronic Kidney Disease.    Anion gap 8 5 - 15  Glucose, capillary     Status: Abnormal   Collection Time: 08/13/16  7:45 AM  Result Value Ref Range   Glucose-Capillary 316 (H) 65 - 99 mg/dL  Glucose, capillary     Status: Abnormal   Collection Time: 08/13/16 11:01 AM  Result Value Ref Range   Glucose-Capillary 412 (H) 65 - 99 mg/dL  Glucose, random     Status: Abnormal   Collection Time: 08/13/16 12:08 PM  Result Value Ref Range   Glucose, Bld 408 (H) 65 - 99 mg/dL  Glucose, capillary      Status: Abnormal   Collection Time: 08/13/16  4:25 PM  Result Value Ref Range   Glucose-Capillary 348 (H) 65 - 99 mg/dL  Glucose, capillary     Status: Abnormal   Collection Time: 08/13/16  8:44 PM  Result Value Ref Range   Glucose-Capillary 292 (H) 65 - 99 mg/dL   Comment 1 Notify RN    Comment 2 Document in Chart   Glucose, capillary     Status: Abnormal   Collection Time: 08/14/16  7:43 AM  Result Value Ref Range   Glucose-Capillary 218 (H) 65 - 99 mg/dL   Comment 1 Notify RN    Comment 2 Document in Chart     ABGS  Recent Labs  08/12/16 1120  PHART 7.414  PO2ART 61.0*  HCO3 20.6   CULTURES No results found for this or any previous visit (from the past 240 hour(s)). Studies/Results: No results found.  Medications:  Prior to Admission:  Prescriptions Prior to Admission  Medication Sig Dispense Refill Last Dose  . aspirin 81 MG tablet Take 81 mg by mouth daily.   08/12/2016 at Unknown time  . cyanocobalamin 1000 MCG tablet Take 100 mcg by mouth daily.   08/12/2016 at Unknown time  . Fluticasone-Salmeterol (ADVAIR) 250-50 MCG/DOSE AEPB Inhale 1 puff into the lungs 2 (two) times daily.   08/12/2016 at Unknown time  . Magnesium 250 MG TABS Take 1 tablet by mouth daily.   08/12/2016 at Unknown time  . Multiple Vitamins-Minerals (CENTRUM SILVER 50+MEN PO) Take 1 tablet by mouth daily.   08/12/2016 at Unknown time  . Omega-3 Fatty Acids (FISH OIL) 1000 MG CPDR Take 1,000 mg by mouth.   08/12/2016 at Unknown time  . tiotropium (SPIRIVA) 18 MCG inhalation capsule Place 18 mcg into inhaler and inhale daily.   08/11/2016 at Unknown time  . Vitamin D-Vitamin K (D3 + K2 DOTS) 1000-90 UNIT-MCG TABS Take by mouth.   08/12/2016 at Unknown time  . vitamin E 1000 UNIT capsule Take 1,000 Units by mouth daily.   08/12/2016 at Unknown time   Scheduled: . aspirin  81 mg Oral Daily  . benzonatate  100 mg Oral TID  .  docusate sodium  100 mg Oral BID  . enoxaparin (LOVENOX) injection  40 mg Subcutaneous  Q24H  . guaiFENesin  600 mg Oral BID  . insulin aspart  0-20 Units Subcutaneous TID WC  . insulin aspart  0-5 Units Subcutaneous QHS  . insulin glargine  10 Units Subcutaneous BID  . ipratropium-albuterol  3 mL Nebulization TID  . levofloxacin  500 mg Oral Daily  . magnesium oxide  400 mg Oral Daily  . metFORMIN  500 mg Oral NOW  . mometasone-formoterol  2 puff Inhalation BID  . multivitamin with minerals   Oral Daily  . omega-3 acid ethyl esters  1 g Oral Daily  . predniSONE  40 mg Oral Q breakfast  . cyanocobalamin  100 mcg Oral Daily  . vitamin E  1,000 Units Oral Daily   Continuous:  ZOX:[id number] **OR** acetaminophen, albuterol, ondansetron **OR** ondansetron (ZOFRAN) IV, oxyCODONE  Assesment: He was admitted with COPD exacerbation and elevated blood sugar. He is doing better. His blood sugar is improved. His wife takes insulin at home and says she can give him insulin shots which I think he will need short-term until we can get him established on metformin and we can get him off of steroids. Principal Problem:   COPD with acute exacerbation ([place]) Active Problems:   COPD (chronic obstructive pulmonary disease) ([facility])   Acute respiratory failure with hypoxia ([facility])   Type 2 diabetes mellitus without complication ([facility])   Hyponatremia   COPD exacerbation ([place])    Plan: Discharge home today    LOS: 1 day   Shakaya Bhullar L 08/14/2016, 10:08 AM

## 2016-08-14 NOTE — Care Management CC44 (Signed)
Condition Code 44 Documentation Completed  Patient Details  Name: Jeremy Leon MRN: [id number] Date of Birth: [birth year]   Condition Code 44 given:  Yes Patient signature on Condition Code 44 notice:  Yes Documentation of 2 MD's agreement:  Yes Code 44 added to claim:  Yes    Malcolm Metrohildress, Elanor Cale Demske, RN 08/14/2016, 8:30 AM

## 2016-08-14 NOTE — Discharge Summary (Signed)
Physician Discharge Summary  Patient ID: Jeremy Leon MRN: [id number] DOB/AGE: 04/22/1932 83 y.o. Primary Care Physician:Louna Rothgeb, Ramon Dredge, MD Admit date: 08/12/2016 Discharge date: 08/14/2016    Discharge Diagnoses:   Principal Problem:   COPD with acute exacerbation [facility]) Active Problems:   COPD (chronic obstructive pulmonary disease) ([facility])   Acute respiratory failure with hypoxia ([facility])   Type 2 diabetes mellitus without complication ([facility])   Hyponatremia   COPD exacerbation ([facility])   Allergies as of 08/14/2016      Reactions   Amoxicillin    Itching      Medication List    TAKE these medications   aspirin 81 MG tablet Take 81 mg by mouth daily.   CENTRUM SILVER 50+MEN PO Take 1 tablet by mouth daily.   cyanocobalamin 1000 MCG tablet Take 100 mcg by mouth daily.   D3 + K2 DOTS 1000-90 UNIT-MCG Tabs Take by mouth.   Fish Oil 1000 MG Cpdr Take 1,000 mg by mouth.   Fluticasone-Salmeterol 250-50 MCG/DOSE Aepb Commonly known as:  ADVAIR Inhale 1 puff into the lungs 2 (two) times daily.   guaiFENesin 600 MG 12 hr tablet Commonly known as:  MUCINEX Take 1 tablet (600 mg total) by mouth 2 (two) times daily.   levofloxacin 500 MG tablet Commonly known as:  LEVAQUIN Take 1 tablet (500 mg total) by mouth daily. Start taking on:  08/15/2016   Magnesium 250 MG Tabs Take 1 tablet by mouth daily.   metFORMIN 500 MG tablet Commonly known as:  GLUCOPHAGE Take 1 tablet (500 mg total) by mouth 2 (two) times daily with a meal.   predniSONE 10 MG (21) Tbpk tablet Commonly known as:  STERAPRED UNI-PAK 21 TAB Take by package instructions   tiotropium 18 MCG inhalation capsule Commonly known as:  SPIRIVA Place 18 mcg into inhaler and inhale daily.   vitamin E 1000 UNIT capsule Take 1,000 Units by mouth daily.       Discharged Condition:Improved    Consults: None  Significant Diagnostic Studies: Dg Chest 2 View  Result Date: 08/12/2016 CLINICAL DATA:  Shortness  of breath with cough and congestion EXAM: CHEST  2 VIEW COMPARISON:  May 12, 2009 FINDINGS: There is an apparent nipple shadow on the left. There is bibasilar lung scarring. There is no appreciable edema or consolidation. Heart size is within normal limits. Pulmonary vascularity is within normal limits and stable. There is aortic atherosclerosis. No adenopathy. There is degenerative change in the thoracic spine. IMPRESSION: Scarring in the lung bases. No edema or consolidation. Stable cardiac silhouette. There is aortic atherosclerosis. Aortic Atherosclerosis (ICD10-I70.0). Electronically Signed   By: Bretta Bang III M.D.   On: 08/12/2016 08:33    Lab Results: Basic Metabolic Panel:  Recent Labs  72/53/66 0808 08/13/16 0543 08/13/16 1208  NA 133* 134*  --   K 3.8 4.0  --   CL 100* 103  --   CO2 21* 23  --   GLUCOSE 293* 333* 408*  BUN 19 26*  --   CREATININE 0.99 0.96  --   CALCIUM 8.7* 8.3*  --    Liver Function Tests:  Recent Labs  08/13/16 0543  AST 16  ALT 20  ALKPHOS 68  BILITOT 0.7  PROT 6.0*  ALBUMIN 2.7*     CBC:  Recent Labs  08/12/16 0808 08/13/16 0543  WBC 14.2* 14.1*  NEUTROABS 11.1*  --   HGB 14.1 12.4*  HCT 40.1 36.3*  MCV 86.6 86.6  PLT  209 196    No results found for this or any previous visit (from the past 240 hour(s)).   Hospital Course: This is a 83 year old who came to the emergency department with increased shortness of breath. He's been noticing that his blood sugar has been up over the last 2-3 months. When he came to the emergency department he was hypoxic and brought in for observation. He was started on IV steroids and his blood sugar of course was worse. He was given sliding scale. His breathing improved over the next 48 hours to the point that he was back to baseline. Blood sugar was around 200 at the time of discharge.  Discharge Exam: Blood pressure 102/75, pulse 69, temperature 97.6 F (36.4 C), temperature source Axillary,  resp. rate 16, height 5\' 10"  (1.778 m), weight 94.1 kg (207 lb 6.4 oz), SpO2 94 %. He's awake and alert and in no acute distress.  Disposition: Home. His wife will give him Toujeo 20 units Daily. He will be on metformin. He will have a prednisone taper.     Signed: Kristeen Lantz L   08/14/2016, 10:10 AM

## 2016-08-14 NOTE — Care Management Obs Status (Deleted)
MEDICARE OBSERVATION STATUS NOTIFICATION   Patient Details  Name: Jeremy Leon MRN: [id number] Date of Birth: December 29, 1932   Medicare Observation Status Notification Given:       Malcolm MetroChildress, Azoria Abbett Demske, RN 08/14/2016, 8:30 AM

## 2016-09-05 DIAGNOSIS — J449 Chronic obstructive pulmonary disease, unspecified: Secondary | ICD-10-CM | POA: Diagnosis not present

## 2016-09-05 DIAGNOSIS — E119 Type 2 diabetes mellitus without complications: Secondary | ICD-10-CM | POA: Diagnosis not present

## 2016-10-17 DIAGNOSIS — E1165 Type 2 diabetes mellitus with hyperglycemia: Secondary | ICD-10-CM | POA: Diagnosis not present

## 2016-10-17 DIAGNOSIS — J449 Chronic obstructive pulmonary disease, unspecified: Secondary | ICD-10-CM | POA: Diagnosis not present

## 2016-10-17 DIAGNOSIS — I10 Essential (primary) hypertension: Secondary | ICD-10-CM | POA: Diagnosis not present

## 2017-01-09 DIAGNOSIS — E1165 Type 2 diabetes mellitus with hyperglycemia: Secondary | ICD-10-CM | POA: Diagnosis not present

## 2017-01-09 DIAGNOSIS — J449 Chronic obstructive pulmonary disease, unspecified: Secondary | ICD-10-CM | POA: Diagnosis not present

## 2017-01-09 DIAGNOSIS — Z0000 Encounter for general adult medical examination without abnormal findings: Secondary | ICD-10-CM | POA: Diagnosis not present

## 2017-01-09 DIAGNOSIS — I10 Essential (primary) hypertension: Secondary | ICD-10-CM | POA: Diagnosis not present

## 2017-01-22 DIAGNOSIS — Z0000 Encounter for general adult medical examination without abnormal findings: Secondary | ICD-10-CM | POA: Diagnosis not present

## 2017-06-24 DIAGNOSIS — J449 Chronic obstructive pulmonary disease, unspecified: Secondary | ICD-10-CM | POA: Diagnosis not present

## 2017-06-24 DIAGNOSIS — H9193 Unspecified hearing loss, bilateral: Secondary | ICD-10-CM | POA: Diagnosis not present

## 2017-06-24 DIAGNOSIS — E119 Type 2 diabetes mellitus without complications: Secondary | ICD-10-CM | POA: Diagnosis not present

## 2017-08-11 DIAGNOSIS — J441 Chronic obstructive pulmonary disease with (acute) exacerbation: Secondary | ICD-10-CM | POA: Diagnosis not present

## 2017-08-11 DIAGNOSIS — H9193 Unspecified hearing loss, bilateral: Secondary | ICD-10-CM | POA: Diagnosis not present

## 2017-08-11 DIAGNOSIS — E1165 Type 2 diabetes mellitus with hyperglycemia: Secondary | ICD-10-CM | POA: Diagnosis not present

## 2017-12-25 DIAGNOSIS — Z0000 Encounter for general adult medical examination without abnormal findings: Secondary | ICD-10-CM | POA: Diagnosis not present

## 2017-12-25 DIAGNOSIS — J449 Chronic obstructive pulmonary disease, unspecified: Secondary | ICD-10-CM | POA: Diagnosis not present

## 2017-12-25 DIAGNOSIS — H9193 Unspecified hearing loss, bilateral: Secondary | ICD-10-CM | POA: Diagnosis not present

## 2017-12-25 DIAGNOSIS — E119 Type 2 diabetes mellitus without complications: Secondary | ICD-10-CM | POA: Diagnosis not present

## 2017-12-26 LAB — COMPREHENSIVE METABOLIC PANEL
Albumin: 4.1 (ref 3.5–5.0)
Calcium: 9.5 (ref 8.7–10.7)
GFR calc Af Amer: 76
GFR calc non Af Amer: 66

## 2017-12-26 LAB — CBC AND DIFFERENTIAL
HCT: 43 (ref 41–53)
Hemoglobin: 4.5 — AB (ref 13.5–17.5)
Neutrophils Absolute: 7
Platelets: 268 (ref 150–399)
WBC: 9.4

## 2017-12-26 LAB — LIPID PANEL
Cholesterol: 156 (ref 0–200)
HDL: 38 (ref 35–70)
LDL Cholesterol: 87
Triglycerides: 153 (ref 40–160)

## 2017-12-26 LAB — BASIC METABOLIC PANEL
BUN: 17 (ref 4–21)
CO2: 24 — AB (ref 13–22)
Chloride: 102 (ref 99–108)
Creatinine: 1 (ref <=1.3)
Glucose: 158
Potassium: 4.5 (ref 3.4–5.3)
Sodium: 143 (ref 137–147)

## 2017-12-26 LAB — TSH: TSH: 3.23 (ref <=5.90)

## 2017-12-26 LAB — HEPATIC FUNCTION PANEL
ALT: 30 (ref 10–40)
AST: 22 (ref 14–40)
Alkaline Phosphatase: 77 (ref 25–125)
Bilirubin, Total: 0.4

## 2017-12-26 LAB — CBC: RBC: 4.94 (ref 3.87–5.11)

## 2018-06-25 ENCOUNTER — Encounter: Payer: Self-pay | Admitting: Pulmonary Disease

## 2018-06-25 DIAGNOSIS — J301 Allergic rhinitis due to pollen: Secondary | ICD-10-CM | POA: Diagnosis not present

## 2018-06-25 DIAGNOSIS — E1165 Type 2 diabetes mellitus with hyperglycemia: Secondary | ICD-10-CM | POA: Diagnosis not present

## 2018-06-25 DIAGNOSIS — J449 Chronic obstructive pulmonary disease, unspecified: Secondary | ICD-10-CM | POA: Diagnosis not present

## 2018-12-04 DIAGNOSIS — H9193 Unspecified hearing loss, bilateral: Secondary | ICD-10-CM | POA: Diagnosis not present

## 2018-12-04 DIAGNOSIS — E1165 Type 2 diabetes mellitus with hyperglycemia: Secondary | ICD-10-CM | POA: Diagnosis not present

## 2018-12-04 DIAGNOSIS — J449 Chronic obstructive pulmonary disease, unspecified: Secondary | ICD-10-CM | POA: Diagnosis not present

## 2018-12-05 LAB — HEMOGLOBIN A1C: Hemoglobin A1C: 7.3

## 2018-12-08 ENCOUNTER — Other Ambulatory Visit (HOSPITAL_COMMUNITY): Payer: Self-pay | Admitting: Pulmonary Disease

## 2018-12-08 DIAGNOSIS — I739 Peripheral vascular disease, unspecified: Secondary | ICD-10-CM

## 2018-12-15 ENCOUNTER — Ambulatory Visit (HOSPITAL_COMMUNITY)
Admission: RE | Admit: 2018-12-15 | Discharge: 2018-12-15 | Disposition: A | Discharge status: Home / Self Care | Payer: Medicare Other | Source: Ambulatory Visit | Attending: Pulmonary Disease | Admitting: Pulmonary Disease

## 2018-12-15 ENCOUNTER — Other Ambulatory Visit: Payer: Self-pay

## 2018-12-15 ENCOUNTER — Other Ambulatory Visit (HOSPITAL_COMMUNITY): Payer: Self-pay | Admitting: Pulmonary Disease

## 2018-12-15 DIAGNOSIS — I739 Peripheral vascular disease, unspecified: Secondary | ICD-10-CM | POA: Diagnosis not present

## 2018-12-22 DIAGNOSIS — E1140 Type 2 diabetes mellitus with diabetic neuropathy, unspecified: Secondary | ICD-10-CM | POA: Diagnosis not present

## 2018-12-22 DIAGNOSIS — J449 Chronic obstructive pulmonary disease, unspecified: Secondary | ICD-10-CM | POA: Diagnosis not present

## 2018-12-22 DIAGNOSIS — H9193 Unspecified hearing loss, bilateral: Secondary | ICD-10-CM | POA: Diagnosis not present

## 2019-01-12 ENCOUNTER — Other Ambulatory Visit: Payer: Self-pay

## 2019-01-12 ENCOUNTER — Encounter: Payer: Self-pay | Admitting: Family Medicine

## 2019-01-12 ENCOUNTER — Ambulatory Visit (INDEPENDENT_AMBULATORY_CARE_PROVIDER_SITE_OTHER): Payer: Medicare Other | Admitting: Family Medicine

## 2019-01-12 VITALS — Temp 98.2°F | Ht 70.0 in | Wt 205.8 lb

## 2019-01-12 DIAGNOSIS — J449 Chronic obstructive pulmonary disease, unspecified: Secondary | ICD-10-CM | POA: Diagnosis not present

## 2019-01-12 DIAGNOSIS — E1140 Type 2 diabetes mellitus with diabetic neuropathy, unspecified: Secondary | ICD-10-CM | POA: Diagnosis not present

## 2019-01-12 LAB — BASIC METABOLIC PANEL
BUN: 18 (ref 4–21)
CO2: 26 — AB (ref 13–22)
Chloride: 104 (ref 99–108)
Creatinine: 0.8 (ref 0.6–1.3)
Glucose: 129
Potassium: 4.8 (ref 3.4–5.3)
Sodium: 143 (ref 137–147)

## 2019-01-12 LAB — COMPREHENSIVE METABOLIC PANEL
Albumin: 4.5 (ref 3.5–5.0)
Calcium: 9.6 (ref 8.7–10.7)
GFR calc Af Amer: 94
GFR calc non Af Amer: 82
Globulin: 2.4

## 2019-01-12 LAB — LIPID PANEL
Cholesterol: 188 (ref 0–200)
HDL: 44 (ref 35–70)
LDL Cholesterol: 110
Triglycerides: 196 — AB (ref 40–160)

## 2019-01-12 LAB — CBC AND DIFFERENTIAL
HCT: 44 (ref 41–53)
Hemoglobin: 14.7 (ref 13.5–17.5)
Neutrophils Absolute: 5
Platelets: 229 (ref 150–399)
WBC: 9.8

## 2019-01-12 LAB — MICROALBUMIN, URINE: Microalb, Ur: 5.7

## 2019-01-12 LAB — CBC: RBC: 5.09 (ref 3.87–5.11)

## 2019-01-12 MED ORDER — GABAPENTIN 300 MG PO CAPS: 300.0000 mg | ORAL_CAPSULE | Freq: Every day | ORAL | 1 refills | Status: DC

## 2019-01-12 NOTE — Patient Instructions (Addendum)
Continue taking gabapentin 300mg  at night Check glucose fasting at home-write down in journal Fasting labwork

## 2019-01-12 NOTE — Progress Notes (Signed)
New Patient Office Visit  Subjective:  Patient ID: Jeremy Leon, male    DOB: [birth year]  Age: 85 y.o. MRN: [id number]  CC:  Chief Complaint  Patient presents with  . Establish Care  DM/peripheral neuropathy-improved with gabapentin-completed ABI with normal blood flow per pt COPD-advair/spiriva-no longer taking steroids B12 deficiency-takes oral B12   HPI Jeremy Leon presents for COPD-takes medication daily-currently well controlled DM-glucophage, gabapentin-well controlled glucose , concern for peripheral neuropathy-pt not taking glucose readings at home. No recent fasting labwork     Past Medical History:  Diagnosis Date  . Borderline diabetes   . COPD (chronic obstructive pulmonary disease) (HCC)   . Decreased hearing of both ears   . Diabetes mellitus without complication (HCC)   . History of cardiac catheterization    No significant CAD 2007    Past Surgical History:  Procedure Laterality Date  . TONSILLECTOMY    . VASECTOMY      Family History  Problem Relation Age of Onset  . Colon cancer Father        Died age 71  . Stroke Mother   . Leukemia Brother        Died age 3  . Diabetes Mellitus II Brother     Social History   Socioeconomic History  . Marital status: Married    Spouse name: Not on file  . Number of children: Not on file  . Years of education: Not on file  . Highest education level: Not on file  Occupational History  . Occupation: retired  Engineer, production  . Financial resource strain: Not on file  . Food insecurity    Worry: Not on file    Inability: Not on file  . Transportation needs    Medical: Not on file    Non-medical: Not on file  Tobacco Use  . Smoking status: Former Smoker    Types: Cigarettes    Quit date: 02/11/1997    Years since quitting: 21.9  . Smokeless tobacco: Never Used  Substance and Sexual Activity  . Alcohol use: No    Alcohol/week: 0.0 standard drinks  . Drug use: No  . Sexual activity: Yes   Lifestyle  . Physical activity    Days per week: Not on file    Minutes per session: Not on file  . Stress: Not on file  Relationships  . Social Musician on phone: Not on file    Gets together: Not on file    Attends religious service: Not on file    Active member of club or organization: Not on file    Attends meetings of clubs or organizations: Not on file    Relationship status: Not on file  . Intimate partner violence    Fear of current or ex partner: Not on file    Emotionally abused: Not on file    Physically abused: Not on file    Forced sexual activity: Not on file  Other Topics Concern  . Not on file  Social History Narrative  . Not on file    ROS Review of Systems  Constitutional: Positive for diaphoresis and fatigue.  HENT: Positive for hearing loss.   Eyes: Positive for visual disturbance.  Respiratory: Positive for shortness of breath.   Endocrine: Positive for heat intolerance.    Objective:   Today's Vitals: Temp 98.2 F (36.8 C) (Oral)   Ht 5\' 10"  (1.778 m)   Wt 205 lb 12.8 oz (93.4  kg)   SpO2 94%   BMI 29.53 kg/m   Physical Exam Constitutional:      Appearance: Normal appearance.  HENT:     Head: Normocephalic and atraumatic.  Eyes:     Conjunctiva/sclera: Conjunctivae normal.  Neck:     Musculoskeletal: Normal range of motion and neck supple.  Cardiovascular:     Rate and Rhythm: Normal rate and regular rhythm.     Pulses: Normal pulses.     Heart sounds: Normal heart sounds.  Musculoskeletal: Normal range of motion.  Neurological:     Mental Status: He is alert and oriented to person, place, and time.  Psychiatric:        Mood and Affect: Mood normal.        Behavior: Behavior normal.     Assessment & Plan:   1. Chronic obstructive pulmonary disease, unspecified COPD type (HCC) - COMPLETE METABOLIC PANEL WITH GFR spiriva/advair  2. Type 2 diabetes mellitus with diabetic neuropathy, without long-term current use of  insulin (HCC) metformin - COMPLETE METABOLIC PANEL WITH GFR - Microalbumin, urine - Lipid panel - CBC with Differential Outpatient Encounter Medications as of 01/12/2019  Medication Sig  . gabapentin (NEURONTIN) 300 MG capsule Take 300 mg by mouth at bedtime.  . Magnesium 250 MG TABS Take 1 tablet by mouth daily.  . metFORMIN (GLUCOPHAGE) 500 MG tablet Take by mouth 2 (two) times daily with a meal.  . [DISCONTINUED] metFORMIN (GLUCOPHAGE) 500 MG tablet Take 1 tablet (500 mg total) by mouth 2 (two) times daily with a meal.  . aspirin 81 MG tablet Take 81 mg by mouth daily.  . cyanocobalamin 1000 MCG tablet Take 100 mcg by mouth daily.  . Fluticasone-Salmeterol (ADVAIR) 250-50 MCG/DOSE AEPB Inhale 1 puff into the lungs 2 (two) times daily.  . Multiple Vitamins-Minerals (CENTRUM SILVER 50+MEN PO) Take 1 tablet by mouth daily.  . Omega-3 Fatty Acids (FISH OIL) 1000 MG CPDR Take 1,000 mg by mouth.  . predniSONE (STERAPRED UNI-PAK 21 TAB) 10 MG (21) TBPK tablet Take by package instructions  . tiotropium (SPIRIVA) 18 MCG inhalation capsule Place 18 mcg into inhaler and inhale daily.  . Vitamin D-Vitamin K (D3 + K2 DOTS) 1000-90 UNIT-MCG TABS Take by mouth.  . vitamin E 1000 UNIT capsule Take 1,000 Units by mouth daily.  . [DISCONTINUED] guaiFENesin (MUCINEX) 600 MG 12 hr tablet Take 1 tablet (600 mg total) by mouth 2 (two) times daily. (Patient not taking: Reported on 01/12/2019)  . [DISCONTINUED] levofloxacin (LEVAQUIN) 500 MG tablet Take 1 tablet (500 mg total) by mouth daily. (Patient not taking: Reported on 01/12/2019)   No facility-administered encounter medications on file as of 01/12/2019.     Follow-up: No follow-ups on file.   Kenishia Plack Hannah Beat, MD

## 2019-07-09 ENCOUNTER — Other Ambulatory Visit: Payer: Self-pay | Admitting: Family Medicine

## 2019-07-13 ENCOUNTER — Ambulatory Visit: Payer: Medicare Other | Admitting: Family Medicine

## 2019-07-13 ENCOUNTER — Other Ambulatory Visit: Payer: Self-pay

## 2019-07-13 ENCOUNTER — Encounter: Payer: Self-pay | Admitting: Family Medicine

## 2019-07-13 VITALS — BP 121/76 | HR 82 | Temp 98.0°F | Wt 210.6 lb

## 2019-07-13 DIAGNOSIS — J449 Chronic obstructive pulmonary disease, unspecified: Secondary | ICD-10-CM

## 2019-07-13 DIAGNOSIS — E1140 Type 2 diabetes mellitus with diabetic neuropathy, unspecified: Secondary | ICD-10-CM

## 2019-07-13 NOTE — Patient Instructions (Signed)
Make an appointment for eye exam/diabetic  Labwork non-fasting

## 2019-07-13 NOTE — Progress Notes (Signed)
Established Patient Office Visit  Subjective:  Patient ID: Jeremy Leon, male    DOB: [birth year]  Age: 86 y.o. MRN: [id number]  CC:  Chief Complaint  Patient presents with  . COPD    6 month    HPI Jeremy Leon presents for DM-glucose 170-210-glucophage  BID COPD-Trelegy-pt states no worsening symptoms-pt no longer taking spiriva or advair Neuropathy-using gabapentin daily Omega 3 otc  Past Medical History:  Diagnosis Date  . Borderline diabetes   . COPD (chronic obstructive pulmonary disease) (HCC)   . Decreased hearing of both ears   . Diabetes mellitus without complication (HCC)   . History of cardiac catheterization    No significant CAD 2007    Past Surgical History:  Procedure Laterality Date  . TONSILLECTOMY    . VASECTOMY      Family History  Problem Relation Age of Onset  . Colon cancer Father        Died age 24  . Stroke Mother   . Leukemia Brother        Died age 51  . Diabetes Mellitus II Brother     Social History   Socioeconomic History  . Marital status: Married    Spouse name: Not on file  . Number of children: Not on file  . Years of education: Not on file  . Highest education level: Not on file  Occupational History  . Occupation: retired  Tobacco Use  . Smoking status: Former Smoker    Types: Cigarettes    Quit date: 02/11/1997    Years since quitting: 22.4  . Smokeless tobacco: Never Used  Substance and Sexual Activity  . Alcohol use: No    Alcohol/week: 0.0 standard drinks  . Drug use: No  . Sexual activity: Yes  Other Topics Concern  . Not on file  Social History Narrative  . Not on file   Social Determinants of Health   Financial Resource Strain:   . Difficulty of Paying Living Expenses:   Food Insecurity:   . Worried About Programme researcher, broadcasting/film/video in the Last Year:   . Barista in the Last Year:   Transportation Needs:   . Freight forwarder (Medical):   Marland Kitchen Lack of Transportation (Non-Medical):    Physical Activity:   . Days of Exercise per Week:   . Minutes of Exercise per Session:   Stress:   . Feeling of Stress :   Social Connections:   . Frequency of Communication with Friends and Family:   . Frequency of Social Gatherings with Friends and Family:   . Attends Religious Services:   . Active Member of Clubs or Organizations:   . Attends Banker Meetings:   Marland Kitchen Marital Status:   Intimate Partner Violence:   . Fear of Current or Ex-Partner:   . Emotionally Abused:   Marland Kitchen Physically Abused:   . Sexually Abused:     Outpatient Medications Prior to Visit  Medication Sig Dispense Refill  . aspirin 81 MG tablet Take 81 mg by mouth daily.    . cyanocobalamin 1000 MCG tablet Take 100 mcg by mouth daily.    . Fluticasone-Salmeterol (ADVAIR) 250-50 MCG/DOSE AEPB Inhale 1 puff into the lungs 2 (two) times daily.    Marland Kitchen gabapentin (NEURONTIN) 300 MG capsule TAKE 1 CAPSULE BY MOUTH EVERYDAY AT BEDTIME 90 capsule 1  . Magnesium 250 MG TABS Take 1 tablet by mouth daily.    . metFORMIN (GLUCOPHAGE) 1000  MG tablet Take 1,000 mg by mouth 2 (two) times daily.    . Multiple Vitamins-Minerals (CENTRUM SILVER 50+MEN PO) Take 1 tablet by mouth daily.    . Omega-3 Fatty Acids (FISH OIL) 1000 MG CPDR Take 1,000 mg by mouth.    . tiotropium (SPIRIVA) 18 MCG inhalation capsule Place 18 mcg into inhaler and inhale daily.    . Vitamin D-Vitamin K (D3 + K2 DOTS) 1000-90 UNIT-MCG TABS Take by mouth.    . vitamin E 1000 UNIT capsule Take 1,000 Units by mouth daily.    . metFORMIN (GLUCOPHAGE) 500 MG tablet Take by mouth 2 (two) times daily with a meal.    . ACCU-CHEK GUIDE test strip USE TO TEST TWICE DAILY.2     No facility-administered medications prior to visit.    Allergies  Allergen Reactions  . Amoxicillin     Itching     ROS Review of Systems  Constitutional: Negative.   HENT: Positive for hearing loss.   Eyes:       No recent exam  Respiratory:       COPD  Cardiovascular:  Negative.   Gastrointestinal: Negative.   Endocrine:       DM  Genitourinary: Negative.   Musculoskeletal: Negative.   Allergic/Immunologic: Negative.   Neurological:       Peripheral neuropathy  Hematological: Negative.   Psychiatric/Behavioral: Negative.       Objective:    Physical Exam  Constitutional: He is oriented to person, place, and time. He appears well-developed and well-nourished.  HENT:  Head: Normocephalic and atraumatic.  Eyes: Conjunctivae are normal.  Cardiovascular: Normal rate, regular rhythm, normal heart sounds and intact distal pulses.  Pulmonary/Chest: Effort normal and breath sounds normal.  Neurological: He is alert and oriented to person, place, and time.  Psychiatric: He has a normal mood and affect. His behavior is normal.    BP 121/76 (BP Location: Left Arm, Patient Position: Sitting)   Pulse 82   Temp 98 F (36.7 C) (Temporal)   Wt 210 lb 9.6 oz (95.5 kg)   SpO2 93%   BMI 30.22 kg/m  Wt Readings from Last 3 Encounters:  07/13/19 210 lb 9.6 oz (95.5 kg)  01/12/19 205 lb 12.8 oz (93.4 kg)  08/14/16 207 lb 6.4 oz (94.1 kg)     Health Maintenance Due  Topic Date Due  . OPHTHALMOLOGY EXAM  Never done  . URINE MICROALBUMIN  Never done  . HEMOGLOBIN A1C  06/05/2019    Lab Results  Component Value Date   TSH 3.23 12/26/2017   Lab Results  Component Value Date   WBC 9.4 12/26/2017   HGB 4.5 (A) 12/26/2017   HCT 43 12/26/2017   MCV 86.6 08/13/2016   PLT 268 12/26/2017   Lab Results  Component Value Date   NA 143 12/26/2017   K 4.5 12/26/2017   CO2 24 (A) 12/26/2017   GLUCOSE 408 (H) 08/13/2016   BUN 17 12/26/2017   CREATININE 1.0 12/26/2017   BILITOT 0.7 08/13/2016   ALKPHOS 77 12/26/2017   AST 22 12/26/2017   ALT 30 12/26/2017   PROT 6.0 (L) 08/13/2016   ALBUMIN 4.1 12/26/2017   CALCIUM 9.5 12/26/2017   ANIONGAP 8 08/13/2016   Lab Results  Component Value Date   CHOL 156 12/26/2017   Lab Results  Component Value  Date   HDL 38 12/26/2017   Lab Results  Component Value Date   LDLCALC 87 12/26/2017   Lab Results  Component Value Date   TRIG 153 12/26/2017   No results found for: [facility] Lab Results  Component Value Date   HGBA1C 7.3 12/05/2018      Assessment & Plan:  1. Chronic obstructive pulmonary disease, unspecified COPD type (HCC) Trelegy started by Dr. Christophe Louis does not have a rescue inhaler and does not want an rx  2. Type 2 diabetes mellitus with diabetic neuropathy, without long-term current use of insulin (HCC) Metformin-last A1c-7.3%-10/20-pt states labwork last at [facility], gabapentin at night. Needs eye exam Follow-up: primary care physician , eye exam   Tewana Bohlen Mat Carne, MD

## 2019-07-14 ENCOUNTER — Other Ambulatory Visit: Payer: Self-pay | Admitting: Family Medicine

## 2019-07-14 ENCOUNTER — Encounter: Payer: Self-pay | Admitting: Family Medicine

## 2019-07-14 DIAGNOSIS — E1140 Type 2 diabetes mellitus with diabetic neuropathy, unspecified: Secondary | ICD-10-CM | POA: Diagnosis not present

## 2019-07-15 LAB — RENAL FUNCTION PANEL
Albumin: 4.6 g/dL (ref 3.6–4.6)
BUN/Creatinine Ratio: 18 (ref 10–24)
BUN: 21 mg/dL (ref 8–27)
CO2: 23 mmol/L (ref 20–29)
Calcium: 9.9 mg/dL (ref 8.6–10.2)
Chloride: 101 mmol/L (ref 96–106)
Creatinine, Ser: 1.15 mg/dL (ref 0.76–1.27)
GFR calc Af Amer: 67 mL/min/{1.73_m2} (ref >59)
GFR calc non Af Amer: 58 mL/min/{1.73_m2} — ABNORMAL LOW (ref >59)
Glucose: 217 mg/dL — ABNORMAL HIGH (ref 65–99)
Phosphorus: 4.1 mg/dL (ref 2.8–4.1)
Potassium: 4.7 mmol/L (ref 3.5–5.2)
Sodium: 140 mmol/L (ref 134–144)

## 2019-07-15 LAB — HGB A1C W/O EAG: Hgb A1c MFr Bld: 9.4 % — ABNORMAL HIGH (ref 4.8–5.6)

## 2019-08-05 ENCOUNTER — Encounter: Payer: Self-pay | Admitting: Family Medicine

## 2019-08-05 ENCOUNTER — Other Ambulatory Visit: Payer: Self-pay

## 2019-08-05 ENCOUNTER — Ambulatory Visit (INDEPENDENT_AMBULATORY_CARE_PROVIDER_SITE_OTHER): Payer: Medicare Other | Admitting: Family Medicine

## 2019-08-05 VITALS — BP 142/84 | HR 80 | Temp 97.1°F | Ht 69.0 in | Wt 209.4 lb

## 2019-08-05 DIAGNOSIS — I10 Essential (primary) hypertension: Secondary | ICD-10-CM

## 2019-08-05 DIAGNOSIS — H906 Mixed conductive and sensorineural hearing loss, bilateral: Secondary | ICD-10-CM | POA: Diagnosis not present

## 2019-08-05 DIAGNOSIS — J449 Chronic obstructive pulmonary disease, unspecified: Secondary | ICD-10-CM | POA: Diagnosis not present

## 2019-08-05 DIAGNOSIS — E6609 Other obesity due to excess calories: Secondary | ICD-10-CM | POA: Diagnosis not present

## 2019-08-05 DIAGNOSIS — E1140 Type 2 diabetes mellitus with diabetic neuropathy, unspecified: Secondary | ICD-10-CM | POA: Diagnosis not present

## 2019-08-05 DIAGNOSIS — Z6830 Body mass index (BMI) 30.0-30.9, adult: Secondary | ICD-10-CM

## 2019-08-05 NOTE — Progress Notes (Signed)
Subjective:  Patient ID: Jeremy Leon, male    DOB: [birth year]  Age: 86 y.o. MRN: [id number]  CC:  Chief Complaint  Patient presents with  . New Patient (Initial Visit)    New pt former dr Leon/dr Leon pt no issues       HPI  HPI  Jeremy Leon, " Jeremy Leon" is an 86 year old male patient who presents today to establish care.  Previous patient of Jeremy Leon.  Comes in today with his wife who is also establishing care.  Has a significant history of COPD, type 2 diabetes, hard of hearing bilateral ears.  Overall he reports that he is doing well and does not have any complaints today.  He denies having much sleep trouble but at times has a difficult time with this he thinks is mostly because he takes care of his wife.  He does not like to go to the dentist but overall he has no chewing or swallowing problems or eating problems.  He denies having any difficulties or trouble going to the bathroom.  Only gets up once or twice a night to void.  Has a good stream.  Denies having any blood in his urine or stool.  He reports some forgetfulness but overall he thinks that his memory is intact and not having any issues with this.  He denies having any history of falls.  Denies having any issues with his skin.  Denies having any chest pain, shortness of breath, headaches, dizziness, vision changes, leg swelling or palpitations.  Did get the [place] vaccine.  Tolerated that well without any issue.  In general he does not like to get vaccines.  He goes to [facility] for eye care.  He does not have a rescue inhaler however he does use his trilogy as directed and without issue.  He does get some shortness of breath he reports that he has not been as active as he used to be prior to his wife strokes.  Because he usually takes care of her secondary to her left arm paralysis.  His last A1c was elevated at 9.4 on June 2.  He is taking Metformin twice daily.  He notes that his diet is not  where he needs to be enhanced sweet tooth.   Today patient denies signs and symptoms of COVID 19 infection including fever, chills, cough, shortness of breath, and headache. Past Medical, Surgical, Social History, Allergies, and Medications have been Reviewed.   Past Medical History:  Diagnosis Date  . Borderline diabetes   . COPD (chronic obstructive pulmonary disease) ([place])   . Decreased hearing of both ears   . Diabetes mellitus without complication ([place])   . History of cardiac catheterization    No significant CAD 2007    Current Meds  Medication Sig  . ACCU-CHEK GUIDE test strip USE TO TEST TWICE DAILY.2  . aspirin 81 MG tablet Take 81 mg by mouth daily.  . cyanocobalamin 1000 MCG tablet Take 100 mcg by mouth daily.  . Fluticasone-Umeclidin-Vilant (TRELEGY ELLIPTA) 100-62.5-25 MCG/INH AEPB Inhale into the lungs.  . gabapentin (NEURONTIN) 300 MG capsule TAKE 1 CAPSULE BY MOUTH EVERYDAY AT BEDTIME  . Magnesium 250 MG TABS Take 1 tablet by mouth daily.  . metFORMIN (GLUCOPHAGE) 1000 MG tablet Take 1,000 mg by mouth 2 (two) times daily.  . Multiple Vitamins-Minerals (CENTRUM SILVER 50+MEN PO) Take 1 tablet by mouth daily.  . Omega-3 Fatty Acids (FISH OIL) 1000 MG CPDR Take  1,000 mg by mouth.  . Vitamin D-Vitamin K (D3 + K2 DOTS) 1000-90 UNIT-MCG TABS Take by mouth.  . vitamin E 1000 UNIT capsule Take 1,000 Units by mouth daily.    ROS:  Review of Systems  Constitutional: Negative.   HENT: Negative.   Eyes: Negative.   Respiratory: Negative.   Cardiovascular: Negative.   Gastrointestinal: Negative.   Genitourinary: Negative.   Musculoskeletal: Negative.   Skin: Negative.   Neurological: Negative.   Endo/Heme/Allergies: Negative.   Psychiatric/Behavioral: Negative.   All other systems reviewed and are negative.    Objective:   Today's Vitals: BP (!) 142/84 (BP Location: Right Arm, Patient Position: Sitting, Cuff Size: Normal)   Pulse 80   Temp (!) 97.1 F (36.2  C) (Temporal)   Ht 5\' 9"  (1.753 m)   Wt 209 lb 6.4 oz (95 kg)   SpO2 91%   BMI 30.92 kg/m  Vitals with BMI 08/05/2019 07/13/2019 01/12/2019  Height 5\' 9"  - 5\' 10"   Weight 209 lbs 6 oz 210 lbs 10 oz 205 lbs 13 oz  BMI 30.91 - 29.53  Systolic 142 121 -  Diastolic 84 76 -  Pulse 80 82 -     Physical Exam Vitals and nursing note reviewed.  Constitutional:      Appearance: Normal appearance. He is well-developed and well-groomed. He is obese.  HENT:     Head: Normocephalic and atraumatic.     Right Ear: External ear normal.     Left Ear: External ear normal.     Mouth/Throat:     Comments: Mask in place Eyes:     General:        Right eye: No discharge.        Left eye: No discharge.     Conjunctiva/sclera: Conjunctivae normal.  Cardiovascular:     Rate and Rhythm: Normal rate and regular rhythm.     Pulses: Normal pulses.     Heart sounds: Normal heart sounds.  Pulmonary:     Effort: Pulmonary effort is normal.     Breath sounds: Normal breath sounds.  Musculoskeletal:        General: Normal range of motion.     Cervical back: Normal range of motion and neck supple.  Skin:    General: Skin is warm.  Neurological:     General: No focal deficit present.     Mental Status: He is alert and oriented to person, place, and time.  Psychiatric:        Attention and Perception: Attention normal.        Mood and Affect: Mood normal.        Speech: Speech normal.        Behavior: Behavior normal. Behavior is cooperative.        Thought Content: Thought content normal.        Cognition and Memory: Cognition normal.        Judgment: Judgment normal.     Comments: Pleasant in conversation, good eye contact      Assessment   1. Type 2 diabetes mellitus with diabetic neuropathy, without long-term current use of insulin (HCC)   2. Chronic obstructive pulmonary disease, unspecified COPD type (HCC)   3. Mixed conductive and sensorineural hearing loss of both ears   4. Class 1  obesity due to excess calories with serious comorbidity and body mass index (BMI) of 30.0 to 30.9 in adult   5. Essential hypertension     Tests ordered Orders Placed  This Encounter  Procedures  . CBC  . Lipid panel  . Comprehensive Metabolic Panel (CMET)     Plan: Please see assessment and plan per problem list above.   No orders of the defined types were placed in this encounter.   Patient to follow-up in 3 months  Freddy Finner, NP

## 2019-08-05 NOTE — Patient Instructions (Signed)
I appreciate the opportunity to provide you with care for your health and wellness. Today we discussed: established care   Follow up: 3 months (same day as wife please)  Labs fasting next appt. No referrals today  Great to meet you today!  Try and get back to your handy man items as you can. Focus on diet control for diabetes  Use Melatonin for sleep 3-5 mg 30 mins before bed.  Please continue to practice social distancing to keep you, your family, and our community safe.  If you must go out, please wear a mask and practice good handwashing.  It was a pleasure to see you and I look forward to continuing to work together on your health and well-being. Please do not hesitate to call the office if you need care or have questions about your care.  Have a wonderful day and week. With Gratitude, Tereasa Coop, DNP, AGNP-BC

## 2019-08-05 NOTE — Assessment & Plan Note (Signed)
Obesity is linked to type 2 diabetes, elevated blood pressure.  Jeremy Leon is educated about the importance of exercise daily to help with weight management. A minumum of 30 minutes daily is recommended. Additionally, importance of healthy food choices with portion control discussed.   Wt Readings from Last 3 Encounters:  08/05/19 209 lb 6.4 oz (95 kg)  07/13/19 210 lb 9.6 oz (95.5 kg)  01/12/19 205 lb 12.8 oz (93.4 kg)

## 2019-08-05 NOTE — Assessment & Plan Note (Signed)
Jeremy Leon is encouraged to check blood sugar daily as directed. Continue current medications. Currently is not on a cholesterol medication.  Will be getting updated labs at his next appointment.  Of note he does not like to take a lot of medications. He is reminded the importance of maintaining  good blood sugars,  taking medications as directed, daily foot care, annual eye exams. Additionally educated about keeping good control over blood pressure and cholesterol as well.

## 2019-08-05 NOTE — Assessment & Plan Note (Signed)
Uses trelegy as directed without any issue.  Currently does not have a rescue inhaler.  Does report he uses over-the-counter breathiness at times.  But does not really use anything at all outside of his trilogy.  Denies having any cough fevers chills or shortness of breath or exposure to Covid.  Did have a Covid vaccine.  PE was unremarkable for any wheezing or shortness of breath or changes in lung sounds.  Continue current use of inhaler as prescribed.  Declined getting a rescue inhaler at this time.

## 2019-08-05 NOTE — Assessment & Plan Note (Signed)
Bilateral hearing loss does not have any hearing aids at this time.  Is able to read lips and able to communicate overall well.

## 2019-08-05 NOTE — Assessment & Plan Note (Addendum)
Is not currently taking anything for blood pressure at this time.  Will assess at next visit.  Educated him on the use of a DASH diet and exercise.  He does not care for taking a lot of pills.  Would like to refrain in checking at the next visit before starting anything

## 2019-08-10 DIAGNOSIS — E1140 Type 2 diabetes mellitus with diabetic neuropathy, unspecified: Secondary | ICD-10-CM | POA: Diagnosis not present

## 2019-08-11 ENCOUNTER — Telehealth: Payer: Self-pay | Admitting: Family Medicine

## 2019-08-11 LAB — CBC
Hematocrit: 44.4 % (ref 37.5–51.0)
Hemoglobin: 14.6 g/dL (ref 13.0–17.7)
MCH: 29.2 pg (ref 26.6–33.0)
MCHC: 32.9 g/dL (ref 31.5–35.7)
MCV: 89 fL (ref 79–97)
Platelets: 199 10*3/uL (ref 150–450)
RBC: 5 x10E6/uL (ref 4.14–5.80)
RDW: 13.2 % (ref 11.6–15.4)
WBC: 7.5 10*3/uL (ref 3.4–10.8)

## 2019-08-11 LAB — COMPREHENSIVE METABOLIC PANEL
ALT: 42 IU/L (ref 0–44)
AST: 34 IU/L (ref 0–40)
Albumin/Globulin Ratio: 2.2 (ref 1.2–2.2)
Albumin: 4.4 g/dL (ref 3.6–4.6)
Alkaline Phosphatase: 68 IU/L (ref 48–121)
BUN/Creatinine Ratio: 14 (ref 10–24)
BUN: 15 mg/dL (ref 8–27)
Bilirubin Total: 0.6 mg/dL (ref 0.0–1.2)
CO2: 23 mmol/L (ref 20–29)
Calcium: 9.2 mg/dL (ref 8.6–10.2)
Chloride: 103 mmol/L (ref 96–106)
Creatinine, Ser: 1.06 mg/dL (ref 0.76–1.27)
GFR calc Af Amer: 74 mL/min/{1.73_m2} (ref >59)
GFR calc non Af Amer: 64 mL/min/{1.73_m2} (ref >59)
Globulin, Total: 2 g/dL (ref 1.5–4.5)
Glucose: 200 mg/dL — ABNORMAL HIGH (ref 65–99)
Potassium: 4.9 mmol/L (ref 3.5–5.2)
Sodium: 141 mmol/L (ref 134–144)
Total Protein: 6.4 g/dL (ref 6.0–8.5)

## 2019-08-11 LAB — LIPID PANEL
Chol/HDL Ratio: 4.3 ratio (ref 0.0–5.0)
Cholesterol, Total: 173 mg/dL (ref 100–199)
HDL: 40 mg/dL (ref >39)
LDL Chol Calc (NIH): 104 mg/dL — ABNORMAL HIGH (ref 0–99)
Triglycerides: 168 mg/dL — ABNORMAL HIGH (ref 0–149)
VLDL Cholesterol Cal: 29 mg/dL (ref 5–40)

## 2019-08-11 NOTE — Telephone Encounter (Signed)
Pt. Returning phone call from [facility]

## 2019-08-12 ENCOUNTER — Telehealth: Payer: Self-pay | Admitting: Family Medicine

## 2019-08-12 NOTE — Telephone Encounter (Signed)
Pt called about returning a call from [facility]

## 2019-08-12 NOTE — Telephone Encounter (Signed)
Patient given lab results. 

## 2019-08-12 NOTE — Telephone Encounter (Signed)
Patient aware of results.

## 2019-11-05 ENCOUNTER — Ambulatory Visit: Payer: Medicare Other | Admitting: Family Medicine

## 2020-01-18 ENCOUNTER — Telehealth: Payer: Self-pay

## 2020-01-18 NOTE — Telephone Encounter (Signed)
PT lvm that he needed a call back   LVM for the pt to call us back

## 2020-01-19 ENCOUNTER — Telehealth: Payer: Self-pay

## 2020-01-19 ENCOUNTER — Other Ambulatory Visit: Payer: Self-pay

## 2020-01-19 MED ORDER — ACCU-CHEK GUIDE VI STRP: ORAL_STRIP | 1 refills | Status: DC

## 2020-01-19 NOTE — Telephone Encounter (Signed)
error 

## 2020-01-19 NOTE — Telephone Encounter (Signed)
I am ok with refills. Please send to [facility]. Thank you

## 2020-01-19 NOTE — Telephone Encounter (Signed)
There are no directions with his Trilegy, called pt with no answer.

## 2020-01-19 NOTE — Telephone Encounter (Signed)
Pt came by office wanting a few Rx's sent in. They were previously prescribed by [name]. He needs his Trilegy and Metformin sent to [facility]. He needs Accu check test strips sent to [facility].

## 2020-01-20 ENCOUNTER — Other Ambulatory Visit: Payer: Self-pay

## 2020-01-20 MED ORDER — TRELEGY ELLIPTA 100-62.5-25 MCG/INH IN AEPB
INHALATION_SPRAY | RESPIRATORY_TRACT | 3 refills | Status: DC
Start: 2020-01-20 — End: 2021-09-17

## 2020-01-20 MED ORDER — METFORMIN HCL 1000 MG PO TABS
1000.0000 mg | ORAL_TABLET | Freq: Two times a day (BID) | ORAL | 3 refills | Status: DC
Start: 2020-01-20 — End: 2020-01-20

## 2020-01-20 MED ORDER — METFORMIN HCL 1000 MG PO TABS
1000.0000 mg | ORAL_TABLET | Freq: Two times a day (BID) | ORAL | 3 refills | Status: DC
Start: 2020-01-20 — End: 2020-02-23

## 2020-01-20 MED ORDER — TRELEGY ELLIPTA 100-62.5-25 MCG/INH IN AEPB: INHALATION_SPRAY | RESPIRATORY_TRACT | 3 refills | Status: DC

## 2020-01-20 NOTE — Telephone Encounter (Signed)
Pt called [facility] with directions. Rx sent in.

## 2020-01-20 NOTE — Telephone Encounter (Signed)
Noted, just have him provide his directions and we will get it filled. Thank you for follow up

## 2020-01-27 ENCOUNTER — Other Ambulatory Visit: Payer: Self-pay | Admitting: Family Medicine

## 2020-02-01 ENCOUNTER — Other Ambulatory Visit: Payer: Self-pay

## 2020-02-01 MED ORDER — GABAPENTIN 300 MG PO CAPS: ORAL_CAPSULE | ORAL | 1 refills | Status: DC

## 2020-02-22 ENCOUNTER — Other Ambulatory Visit: Payer: Self-pay | Admitting: Family Medicine

## 2020-03-01 ENCOUNTER — Encounter: Payer: Self-pay | Admitting: Family Medicine

## 2020-03-01 ENCOUNTER — Other Ambulatory Visit: Payer: Self-pay

## 2020-03-01 ENCOUNTER — Ambulatory Visit (INDEPENDENT_AMBULATORY_CARE_PROVIDER_SITE_OTHER): Payer: Medicare Other | Admitting: Family Medicine

## 2020-03-01 VITALS — BP 142/84 | Ht 69.0 in | Wt 209.0 lb

## 2020-03-01 DIAGNOSIS — Z0000 Encounter for general adult medical examination without abnormal findings: Secondary | ICD-10-CM | POA: Diagnosis not present

## 2020-03-01 NOTE — Patient Instructions (Signed)
[name] , Thank you for taking time to come for your Medicare Wellness Visit. I appreciate your ongoing commitment to your health goals. Please review the following plan we discussed and let me know if I can assist you in the future.   Screening recommendations/referrals: Colonoscopy: no longer needed Recommended yearly ophthalmology/optometry visit for glaucoma screening and checkup Recommended yearly dental visit for hygiene and checkup  Vaccinations: Influenza vaccine: Due  Pneumococcal vaccine: Completed  Tdap vaccine: Due  Shingles vaccine: Declined    Advanced directives: Completed, please bring forms so we can copy them to chart   Conditions/risks identified: Falls   Next appointment: No appts are scheduled at this time.   Preventive Care 31 Years and Older, Male Preventive care refers to lifestyle choices and visits with your health care provider that can promote health and wellness. What does preventive care include?  A yearly physical exam. This is also called an annual well check.  Dental exams once or twice a year.  Routine eye exams. Ask your health care provider how often you should have your eyes checked.  Personal lifestyle choices, including:  Daily care of your teeth and gums.  Regular physical activity.  Eating a healthy diet.  Avoiding tobacco and drug use.  Limiting alcohol use.  Practicing safe sex.  Taking low doses of aspirin every day.  Taking vitamin and mineral supplements as recommended by your health care provider. What happens during an annual well check? The services and screenings done by your health care provider during your annual well check will depend on your age, overall health, lifestyle risk factors, and family history of disease. Counseling  Your health care provider may ask you questions about your:  Alcohol use.  Tobacco use.  Drug use.  Emotional well-being.  Home and relationship well-being.  Sexual  activity.  Eating habits.  History of falls.  Memory and ability to understand (cognition).  Work and work Astronomer. Screening  You may have the following tests or measurements:  Height, weight, and BMI.  Blood pressure.  Lipid and cholesterol levels. These may be checked every 5 years, or more frequently if you are over 45 years old.  Skin check.  Lung cancer screening. You may have this screening every year starting at age 26 if you have a 30-pack-year history of smoking and currently smoke or have quit within the past 15 years.  Fecal occult blood test (FOBT) of the stool. You may have this test every year starting at age 62.  Flexible sigmoidoscopy or colonoscopy. You may have a sigmoidoscopy every 5 years or a colonoscopy every 10 years starting at age 7.  Prostate cancer screening. Recommendations will vary depending on your family history and other risks.  Hepatitis C blood test.  Hepatitis B blood test.  Sexually transmitted disease (STD) testing.  Diabetes screening. This is done by checking your blood sugar (glucose) after you have not eaten for a while (fasting). You may have this done every 1-3 years.  Abdominal aortic aneurysm (AAA) screening. You may need this if you are a current or former smoker.  Osteoporosis. You may be screened starting at age 47 if you are at high risk. Talk with your health care provider about your test results, treatment options, and if necessary, the need for more tests. Vaccines  Your health care provider may recommend certain vaccines, such as:  Influenza vaccine. This is recommended every year.  Tetanus, diphtheria, and acellular pertussis (Tdap, Td) vaccine. You may need  a Td booster every 10 years.  Zoster vaccine. You may need this after age 37.  Pneumococcal 13-valent conjugate (PCV13) vaccine. One dose is recommended after age 79.  Pneumococcal polysaccharide (PPSV23) vaccine. One dose is recommended after age  33. Talk to your health care provider about which screenings and vaccines you need and how often you need them. This information is not intended to replace advice given to you by your health care provider. Make sure you discuss any questions you have with your health care provider. Document Released: 02/24/2015 Document Revised: 10/18/2015 Document Reviewed: 11/29/2014 Elsevier Interactive Patient Education  2017 [place] Prevention in the Home Falls can cause injuries. They can happen to people of all ages. There are many things you can do to make your home safe and to help prevent falls. What can I do on the outside of my home?  Regularly fix the edges of walkways and driveways and fix any cracks.  Remove anything that might make you trip as you walk through a door, such as a raised step or threshold.  Trim any bushes or trees on the path to your home.  Use bright outdoor lighting.  Clear any walking paths of anything that might make someone trip, such as rocks or tools.  Regularly check to see if handrails are loose or broken. Make sure that both sides of any steps have handrails.  Any raised decks and porches should have guardrails on the edges.  Have any leaves, snow, or ice cleared regularly.  Use sand or salt on walking paths during winter.  Clean up any spills in your garage right away. This includes oil or grease spills. What can I do in the bathroom?  Use night lights.  Install grab bars by the toilet and in the tub and shower. Do not use towel bars as grab bars.  Use non-skid mats or decals in the tub or shower.  If you need to sit down in the shower, use a plastic, non-slip stool.  Keep the floor dry. Clean up any water that spills on the floor as soon as it happens.  Remove soap buildup in the tub or shower regularly.  Attach bath mats securely with double-sided non-slip rug tape.  Do not have throw rugs and other things on the floor that can make  you trip. What can I do in the bedroom?  Use night lights.  Make sure that you have a light by your bed that is easy to reach.  Do not use any sheets or blankets that are too big for your bed. They should not hang down onto the floor.  Have a firm chair that has side arms. You can use this for support while you get dressed.  Do not have throw rugs and other things on the floor that can make you trip. What can I do in the kitchen?  Clean up any spills right away.  Avoid walking on wet floors.  Keep items that you use a lot in easy-to-reach places.  If you need to reach something above you, use a strong step stool that has a grab bar.  Keep electrical cords out of the way.  Do not use floor polish or wax that makes floors slippery. If you must use wax, use non-skid floor wax.  Do not have throw rugs and other things on the floor that can make you trip. What can I do with my stairs?  Do not leave any items on the  stairs.  Make sure that there are handrails on both sides of the stairs and use them. Fix handrails that are broken or loose. Make sure that handrails are as long as the stairways.  Check any carpeting to make sure that it is firmly attached to the stairs. Fix any carpet that is loose or worn.  Avoid having throw rugs at the top or bottom of the stairs. If you do have throw rugs, attach them to the floor with carpet tape.  Make sure that you have a light switch at the top of the stairs and the bottom of the stairs. If you do not have them, ask someone to add them for you. What else can I do to help prevent falls?  Wear shoes that:  Do not have high heels.  Have rubber bottoms.  Are comfortable and fit you well.  Are closed at the toe. Do not wear sandals.  If you use a stepladder:  Make sure that it is fully opened. Do not climb a closed stepladder.  Make sure that both sides of the stepladder are locked into place.  Ask someone to hold it for you, if  possible.  Clearly mark and make sure that you can see:  Any grab bars or handrails.  First and last steps.  Where the edge of each step is.  Use tools that help you move around (mobility aids) if they are needed. These include:  Canes.  Walkers.  Scooters.  Crutches.  Turn on the lights when you go into a dark area. Replace any light bulbs as soon as they burn out.  Set up your furniture so you have a clear path. Avoid moving your furniture around.  If any of your floors are uneven, fix them.  If there are any pets around you, be aware of where they are.  Review your medicines with your doctor. Some medicines can make you feel dizzy. This can increase your chance of falling. Ask your doctor what other things that you can do to help prevent falls. This information is not intended to replace advice given to you by your health care provider. Make sure you discuss any questions you have with your health care provider. Document Released: 11/24/2008 Document Revised: 07/06/2015 Document Reviewed: 03/04/2014 Elsevier Interactive Patient Education  2017 [employer].

## 2020-03-01 NOTE — Progress Notes (Signed)
Subjective:   Jeremy Leon is a 87 y.o. male who presents for an Initial Medicare Annual Wellness Visit.  Participants:  Patient and Provider for Visit and Wrap up  Method of visit: Telephone  Location of Patient: Home Location of Provider: Office Consent was obtain for visit over the telephone. Services rendered by provider: Visit was performed via telephone   I verified that I am speaking with the correct person using two identifiers.   Review of Systems     Cardiac Risk Factors include: advanced age (>68men, >48 women);diabetes mellitus;obesity (BMI >30kg/m2);sedentary lifestyle;smoking/ tobacco exposure;male gender     Objective:    There were no vitals filed for this visit. There is no height or weight on file to calculate BMI.  Advanced Directives 03/01/2020 08/12/2016 08/12/2016  Does Patient Have a Medical Advance Directive? Yes No Yes  Type of Estate agent of [place];Living will Healthcare Power of [place];Living will Living will;Healthcare Power of Attorney  Copy of Healthcare Power of Attorney in Chart? No - copy requested No - copy requested No - copy requested  Would patient like information on creating a medical advance directive? - No - Patient declined -    Current Medications (verified) Outpatient Encounter Medications as of 03/01/2020  Medication Sig  . ACCU-CHEK GUIDE test strip USE TO TEST TWICE DAILY.2  . aspirin 81 MG tablet Take 81 mg by mouth daily.  . cyanocobalamin 1000 MCG tablet Take 100 mcg by mouth daily.  . Fluticasone-Umeclidin-Vilant (TRELEGY ELLIPTA) 100-62.5-25 MCG/INH AEPB Use I inh PO daily  . gabapentin (NEURONTIN) 300 MG capsule TAKE 1 CAPSULE BY MOUTH EVERYDAY AT BEDTIME  . Magnesium 250 MG TABS Take 1 tablet by mouth daily.  . metFORMIN (GLUCOPHAGE) 1000 MG tablet TAKE 1 TABLET BY MOUTH  TWICE DAILY  . Multiple Vitamins-Minerals (CENTRUM SILVER 50+MEN PO) Take 1 tablet by mouth daily.  . Omega-3 Fatty  Acids (FISH OIL) 1000 MG CPDR Take 1,000 mg by mouth.  . Vitamin D-Vitamin K (D3 + K2 DOTS) 1000-90 UNIT-MCG TABS Take by mouth.  . vitamin E 1000 UNIT capsule Take 1,000 Units by mouth daily.   No facility-administered encounter medications on file as of 03/01/2020.    Allergies (verified) Amoxicillin   History: Past Medical History:  Diagnosis Date  . Borderline diabetes   . COPD (chronic obstructive pulmonary disease) (HCC)   . Decreased hearing of both ears   . Diabetes mellitus without complication (HCC)   . History of cardiac catheterization    No significant CAD 2007   Past Surgical History:  Procedure Laterality Date  . TONSILLECTOMY    . VASECTOMY     Family History  Problem Relation Age of Onset  . Colon cancer Father        Died age 68  . Stroke Mother   . Leukemia Brother        Died age 56  . Diabetes Mellitus II Brother    Social History   Socioeconomic History  . Marital status: Married    Spouse name: Not on file  . Number of children: Not on file  . Years of education: Not on file  . Highest education level: Not on file  Occupational History  . Occupation: retired  Tobacco Use  . Smoking status: Former Smoker    Types: Cigarettes    Quit date: 02/11/1997    Years since quitting: 23.0  . Smokeless tobacco: Never Used  Vaping Use  . Vaping Use:  Never used  Substance and Sexual Activity  . Alcohol use: No    Alcohol/week: 0.0 standard drinks  . Drug use: No  . Sexual activity: Yes  Other Topics Concern  . 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   Social Determinants of Health   Financial Resource Strain: Low Risk   . Difficulty of Paying Living Expenses: Not hard at all  Food Insecurity: No Food Insecurity  . Worried About Brewing technologist in the Last Year: Never true  . Ran Out of Food in the Last Year: Never true  Transportation Needs: No Transportation Needs  . Lack of Transportation (Medical): No  . Lack of Transportation (Non-Medical): No  Physical Activity: Inactive  . Days of Exercise per Week: 0 days  . Minutes of Exercise per Session: 0 min  Stress: No Stress Concern Present  . Feeling of Stress : Not at all  Social Connections: Socially Isolated  . Frequency of Communication with Friends and Family: Once a week  . Frequency of Social Gatherings with Friends and Family: Once a week  . Attends Religious Services: Never  . Active Member of Clubs or Organizations: No  . Attends Banker Meetings: Never  . Marital Status: Married    Tobacco Counseling Counseling given: Not Answered   Clinical Intake:  Pre-visit preparation completed: No  Pain : No/denies pain     Nutritional Risks: None Diabetes: No  How often do you need to have someone help you when you read instructions, pamphlets, or other written materials from your doctor or pharmacy?: 1 - Never What is the last grade level you completed in school?: 14  Diabetic?  Interpreter Needed?: No      Activities of Daily Living In your present state of health, do you have any difficulty performing the following activities: 03/01/2020  Hearing? N  Vision? Y  Difficulty concentrating or making decisions? N  Walking or climbing stairs? Y  Comment slower, winded easy  Dressing or bathing? N  Doing errands, shopping? N  Some recent data might be hidden    Patient Care Team: Freddy Finner, NP as PCP - General (Family Medicine)  Indicate any recent Medical Services you may have received from other than [facility] providers in the past year (date may be approximate).     Assessment:   This is a routine wellness examination for Jeremy Leon.  Hearing/Vision screen No exam data present  Dietary issues and exercise activities  discussed: Current Exercise Habits: The patient does not participate in regular exercise at present, Exercise limited by: respiratory conditions(s)  Goals   None    Depression Screen PHQ 2/9 Scores 03/01/2020 08/05/2019 01/12/2019  PHQ - 2 Score 0 0 0  PHQ- 9 Score - 6 -    Fall Risk Fall Risk  03/01/2020 08/05/2019 01/12/2019  Falls in the past year? 0 0 0  Number falls in past yr: 0 0 0  Injury with Fall? 0 0 0  Risk for fall due to : - No Fall Risks -  Follow up Education provided Falls evaluation completed -    FALL RISK PREVENTION PERTAINING TO THE HOME:  Any stairs in or around the home? Yes  If so, are there any without handrails? Yes  Home free of loose throw rugs in walkways, pet beds, electrical cords, etc? Yes  Adequate lighting in your home to reduce risk of falls? Yes   ASSISTIVE DEVICES UTILIZED TO PREVENT FALLS:  Life alert? No  Use of a cane, walker or w/c? No  Grab bars in the bathroom? Yes  Shower chair or bench in shower? Yes  Elevated toilet seat or a handicapped toilet? Yes   TIMED UP AND GO:  Was the test performed? No .  Length of time to ambulate 10 feet: n/a   Cognitive Function:     6CIT Screen 03/01/2020  What Year? 0 points  What month? 0 points  What time? 0 points  Count back from 20 0 points  Months in reverse 0 points  Repeat phrase 0 points  Total Score 0    Immunizations Immunization History  Administered Date(s) Administered  . Janssen (J&J) SARS-COV-2 Vaccination 05/12/2019    TDAP status: Due, Education has been provided regarding the importance of this vaccine. Advised may receive this vaccine at local pharmacy or Health Dept. Aware to provide a copy of the vaccination record if obtained from local pharmacy or Health Dept. Verbalized acceptance and understanding.  Flu Vaccine status: Due, Education has been provided regarding the importance of this vaccine. Advised may receive this vaccine at local pharmacy or Health Dept.  Aware to provide a copy of the vaccination record if obtained from local pharmacy or Health Dept. Verbalized acceptance and understanding.  Pneumococcal vaccine status: Completed during today's visit.  Covid-19 vaccine status: Completed vaccines  Qualifies for Shingles Vaccine? No   Zostavax completed No   Shingrix Completed?: No.    Education has been provided regarding the importance of this vaccine. Patient has been advised to call insurance company to determine out of pocket expense if they have not yet received this vaccine. Advised may also receive vaccine at local pharmacy or Health Dept. Verbalized acceptance and understanding.  Screening Tests Health Maintenance  Topic Date Due  . OPHTHALMOLOGY EXAM  Never done  . COVID-19 Vaccine (2 - Booster for [employer] series) 07/07/2019  . INFLUENZA VACCINE  Never done  . URINE MICROALBUMIN  01/12/2020  . HEMOGLOBIN A1C  01/13/2020  . TETANUS/TDAP  07/12/2020 (Originally 07/09/1951)  . PNA vac Low Risk Adult (1 of 2 - PCV13) 07/12/2020 (Originally 07/10/1999)  . FOOT EXAM  07/12/2020    Health Maintenance  Health Maintenance Due  Topic Date Due  . OPHTHALMOLOGY EXAM  Never done  . COVID-19 Vaccine (2 - Booster for [employer] series) 07/07/2019  . INFLUENZA VACCINE  Never done  . URINE MICROALBUMIN  01/12/2020  . HEMOGLOBIN A1C  01/13/2020    Colorectal cancer screening: No longer required.   Lung Cancer Screening: (Low Dose CT Chest recommended if Age 55-80 years, 30 pack-year currently smoking OR have quit w/in 15years.) does not qualify.   Lung Cancer Screening Referral: n/a  Additional Screening:  Hepatitis C Screening: does not quality  Vision Screening: Recommended annual ophthalmology exams for early detection of glaucoma and other disorders of the eye. Is the patient up to date with their annual eye exam?  Yes  Who is the provider or what is the name of the office in which the patient attends annual eye exams? Dr  Anette GuarneriSharpio If pt is not established with a provider, would they like to be referred to a provider  to establish care? No .   Dental Screening: Recommended annual dental exams for proper oral hygiene.  Community Resource Referral / Chronic Care Management: CRR required this visit?  Yes   CCM required this visit?  Yes      Plan:      1. Encounter for Medicare annual wellness exam   I have personally reviewed and noted the following in the patient's chart:   . Medical and social history . Use of alcohol, tobacco or illicit drugs  . Current medications and supplements . Functional ability and status . Nutritional status . Physical activity . Advanced directives . List of other physicians . Hospitalizations, surgeries, and ER visits in previous 12 months . Vitals . Screenings to include cognitive, depression, and falls . Referrals and appointments  In addition, I have reviewed and discussed with patient certain preventive protocols, quality metrics, and best practice recommendations. A written personalized care plan for preventive services as well as general preventive health recommendations were provided to patient.      Freddy Finner, NP   03/01/2020   Todays visit was completed over the phone. Pt provided consent for this. Pt location at home. Provider location: home office. The visit took 30 mins to complete.

## 2020-08-01 ENCOUNTER — Other Ambulatory Visit: Payer: Self-pay | Admitting: Family Medicine

## 2020-10-30 ENCOUNTER — Other Ambulatory Visit: Payer: Self-pay | Admitting: Internal Medicine

## 2020-11-01 ENCOUNTER — Ambulatory Visit: Payer: Medicare Other | Admitting: Internal Medicine

## 2020-11-13 ENCOUNTER — Other Ambulatory Visit: Payer: Self-pay

## 2020-11-13 ENCOUNTER — Ambulatory Visit (INDEPENDENT_AMBULATORY_CARE_PROVIDER_SITE_OTHER): Payer: Medicare Other | Admitting: Internal Medicine

## 2020-11-13 ENCOUNTER — Encounter: Payer: Self-pay | Admitting: Internal Medicine

## 2020-11-13 VITALS — BP 130/72 | HR 84 | Temp 98.2°F | Resp 18 | Ht 70.5 in | Wt 201.0 lb

## 2020-11-13 DIAGNOSIS — J439 Emphysema, unspecified: Secondary | ICD-10-CM | POA: Diagnosis not present

## 2020-11-13 DIAGNOSIS — I10 Essential (primary) hypertension: Secondary | ICD-10-CM

## 2020-11-13 DIAGNOSIS — H906 Mixed conductive and sensorineural hearing loss, bilateral: Secondary | ICD-10-CM

## 2020-11-13 DIAGNOSIS — E1142 Type 2 diabetes mellitus with diabetic polyneuropathy: Secondary | ICD-10-CM | POA: Diagnosis not present

## 2020-11-13 DIAGNOSIS — E1140 Type 2 diabetes mellitus with diabetic neuropathy, unspecified: Secondary | ICD-10-CM

## 2020-11-13 DIAGNOSIS — F321 Major depressive disorder, single episode, moderate: Secondary | ICD-10-CM | POA: Insufficient documentation

## 2020-11-13 MED ORDER — GABAPENTIN 300 MG PO CAPS: 300.0000 mg | ORAL_CAPSULE | Freq: Every day | ORAL | 1 refills | Status: DC

## 2020-11-13 NOTE — Patient Instructions (Signed)
Please continue to take medications as prescribed.  Please continue to follow low carb diet and ambulate as tolerated.  Please get hearing test done for hearing aide.  Try to engage in activities of liking.

## 2020-11-13 NOTE — Assessment & Plan Note (Signed)
BP Readings from Last 1 Encounters:  11/13/20 130/72   Well-controlled currently, not on any medications currently Advised DASH diet and moderate exercise/walking as tolerated for now

## 2020-11-13 NOTE — Assessment & Plan Note (Addendum)
Lab Results  Component Value Date   HGBA1C 9.4 (H) 07/14/2019   On Metformin 1000 mg BID Plan to add SGLT2i after checking HbA1c today Advised to follow diabetic diet Not on statin, does not want to take additional medications F/u CMP and lipid panel Diabetic eye exam: Advised to follow up with Ophthalmology for diabetic eye exam

## 2020-11-13 NOTE — Assessment & Plan Note (Signed)
On Gabapentin Symptoms controlled currently 

## 2020-11-13 NOTE — Assessment & Plan Note (Signed)
Well-controlled On Trelegy

## 2020-11-13 NOTE — Assessment & Plan Note (Signed)
Flowsheet Row Office Visit from 11/13/2020 in [facility]  PHQ-9 Total Score 12     Discussed about treatment options, including medications and BH therapy, patient declined both.

## 2020-11-13 NOTE — Progress Notes (Signed)
Established Patient Office Visit  Subjective:  Patient ID: Jeremy Leon, male    DOB: Aug 27, 1932  Age: 87 y.o. MRN: [id number]  CC:  Chief Complaint  Patient presents with   Medication Refill    Pt needs medication refills also pt has been feeling depressed since losing wife in march has no energy and feels like he is losing muscle mass     HPI Jeremy Leon is a 87 year old male with PMH of type II DM with neuropathy, HTN and COPD who presents for follow-up office chronic medical conditions.  Type II DM: His last HbA1c was 9.4 in 07/2019.  He has been taking metformin currently.  His blood glucose ranges between 150-200 at home.  She denies any polyuria or polydipsia.  He takes gabapentin for peripheral neuropathy.  BP is well-controlled. Patient denies headache, dizziness, chest pain, dyspnea or palpitations.  He has been feeling depressed and does not like to do his routine activities and meet with people since losing his wife.  He complains of insomnia and decreased appetite as well.  He does not want to take any medication for depression.  He also declined [place] therapy.  He denies any SI or HI.  He has b/l hearing loss, and is going to get hearing aid for it.   Past Medical History:  Diagnosis Date   Borderline diabetes    COPD (chronic obstructive pulmonary disease) (HCC)    Decreased hearing of both ears    Diabetes mellitus without complication ([place])    History of cardiac catheterization    No significant CAD 2007    Past Surgical History:  Procedure Laterality Date   TONSILLECTOMY     VASECTOMY      Family History  Problem Relation Age of Onset   Colon cancer Father        Died age 53   Stroke Mother    Leukemia Brother        Died age 70   Diabetes Mellitus II Brother     Social History   Socioeconomic History   Marital status: Widowed    Spouse name: Not on file   Number of children: Not on file   Years of education: Not on file   Highest  education level: Not on file  Occupational History   Occupation: retired  Tobacco Use   Smoking status: Former    Types: Cigarettes    Quit date: 02/11/1997    Years since quitting: 23.7   Smokeless tobacco: Never  Vaping Use   Vaping Use: Never used  Substance and Sexual Activity   Alcohol use: No    Alcohol/week: 0.0 standard drinks   Drug use: No   Sexual activity: Yes  Other Topics Concern   Not on file  Social History Narrative         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   Social Determinants of Health   Financial Resource Strain: Not on file  Food Insecurity: Not on file  Transportation Needs: Not on file  Physical Activity: Not on file  Stress: Not on file  Social Connections: Not on file  Intimate Partner Violence: Not on file    Outpatient Medications Prior to Visit  Medication Sig Dispense  Refill   ACCU-CHEK GUIDE test strip USE AS DIRECTED TO TEST TWICE DAILY. 100 strip 0   aspirin 81 MG tablet Take 81 mg by mouth daily.     cyanocobalamin 1000 MCG tablet Take 100 mcg by mouth daily.     Fluticasone-Umeclidin-Vilant (TRELEGY ELLIPTA) 100-62.5-25 MCG/INH AEPB Use I inh PO daily 1 each 3   Magnesium 250 MG TABS Take 1 tablet by mouth daily.     metFORMIN (GLUCOPHAGE) 1000 MG tablet TAKE 1 TABLET BY MOUTH  TWICE DAILY 180 tablet 3   Multiple Vitamins-Minerals (CENTRUM SILVER 50+MEN PO) Take 1 tablet by mouth daily.     Omega-3 Fatty Acids (FISH OIL) 1000 MG CPDR Take 1,000 mg by mouth.     Vitamin D-Vitamin K (D3 + K2 DOTS) 1000-90 UNIT-MCG TABS Take by mouth.     vitamin E 1000 UNIT capsule Take 1,000 Units by mouth daily.     gabapentin (NEURONTIN) 300 MG capsule TAKE 1 CAPSULE BY MOUTH EVERYDAY AT BEDTIME 90 capsule 1   No facility-administered medications prior to visit.    Allergies  Allergen  Reactions   Amoxicillin     Itching     ROS Review of Systems  Constitutional:  Positive for fatigue. Negative for chills and fever.  HENT:  Positive for hearing loss. Negative for congestion and sore throat.   Eyes:  Negative for pain and discharge.  Respiratory:  Negative for cough and shortness of breath.   Cardiovascular:  Negative for chest pain and palpitations.  Gastrointestinal:  Negative for diarrhea, nausea and vomiting.  Endocrine: Negative for polydipsia and polyuria.  Genitourinary:  Negative for dysuria and hematuria.  Musculoskeletal:  Negative for neck pain and neck stiffness.  Skin:  Negative for rash.  Neurological:  Positive for numbness (B/l feet). Negative for dizziness and headaches.  Psychiatric/Behavioral:  Positive for dysphoric mood and sleep disturbance. Negative for agitation, behavioral problems, self-injury and suicidal ideas. The patient is nervous/anxious.      Objective:    Physical Exam Vitals reviewed.  Constitutional:      General: He is not in acute distress.    Appearance: He is not diaphoretic.  HENT:     Head: Normocephalic and atraumatic.     Nose: Nose normal.     Mouth/Throat:     Mouth: Mucous membranes are moist.  Eyes:     General: No scleral icterus.    Extraocular Movements: Extraocular movements intact.  Cardiovascular:     Rate and Rhythm: Normal rate and regular rhythm.     Pulses: Normal pulses.     Heart sounds: Normal heart sounds. No murmur heard. Pulmonary:     Breath sounds: Normal breath sounds. No wheezing or rales.  Abdominal:     Palpations: Abdomen is soft.     Tenderness: There is no abdominal tenderness.  Musculoskeletal:     Cervical back: Neck supple. No tenderness.     Right lower leg: No edema.     Left lower leg: No edema.  Skin:    General: Skin is warm.     Findings: No rash.  Neurological:     General: No focal deficit present.     Mental Status: He is alert and oriented to person, place,  and time.     Sensory: No sensory deficit.     Motor: No weakness.  Psychiatric:        Mood and Affect: Mood normal.        Behavior: Behavior  normal.    BP 130/72 (BP Location: Left Arm, Cuff Size: Normal)   Pulse 84   Temp 98.2 F (36.8 C) (Oral)   Resp 18   Ht 5' 10.5" (1.791 m)   Wt 201 lb (91.2 kg)   SpO2 94%   BMI 28.43 kg/m  Wt Readings from Last 3 Encounters:  11/13/20 201 lb (91.2 kg)  03/01/20 209 lb (94.8 kg)  08/05/19 209 lb 6.4 oz (95 kg)     Health Maintenance Due  Topic Date Due   OPHTHALMOLOGY EXAM  Never done   TETANUS/TDAP  Never done   Zoster Vaccines- Shingrix (1 of 2) Never done   COVID-19 Vaccine (2 - Booster for Janssen series) 07/07/2019   URINE MICROALBUMIN  01/12/2020   HEMOGLOBIN A1C  01/13/2020   FOOT EXAM  07/12/2020   INFLUENZA VACCINE  Never done    There are no preventive care reminders to display for this patient.  Lab Results  Component Value Date   TSH 3.23 12/26/2017   Lab Results  Component Value Date   WBC 7.5 08/10/2019   HGB 14.6 08/10/2019   HCT 44.4 08/10/2019   MCV 89 08/10/2019   PLT 199 08/10/2019   Lab Results  Component Value Date   NA 141 08/10/2019   K 4.9 08/10/2019   CO2 23 08/10/2019   GLUCOSE 200 (H) 08/10/2019   BUN 15 08/10/2019   CREATININE 1.06 08/10/2019   BILITOT 0.6 08/10/2019   ALKPHOS 68 08/10/2019   AST 34 08/10/2019   ALT 42 08/10/2019   PROT 6.4 08/10/2019   ALBUMIN 4.4 08/10/2019   CALCIUM 9.2 08/10/2019   ANIONGAP 8 08/13/2016   Lab Results  Component Value Date   CHOL 173 08/10/2019   Lab Results  Component Value Date   HDL 40 08/10/2019   Lab Results  Component Value Date   LDLCALC 104 (H) 08/10/2019   Lab Results  Component Value Date   TRIG 168 (H) 08/10/2019   Lab Results  Component Value Date   CHOLHDL 4.3 08/10/2019   Lab Results  Component Value Date   HGBA1C 9.4 (H) 07/14/2019      Assessment & Plan:   Problem List Items Addressed This Visit        Cardiovascular and Mediastinum   Essential hypertension    BP Readings from Last 1 Encounters:  11/13/20 130/72  Well-controlled currently, not on any medications currently Advised DASH diet and moderate exercise/walking as tolerated for now      Relevant Orders   CBC with Differential/Platelet     Respiratory   Chronic obstructive pulmonary disease ([place])    Well-controlled On Trelegy        Endocrine   Type 2 diabetes mellitus with diabetic neuropathy, without long-term current use of insulin (HCC) - Primary    Lab Results  Component Value Date   HGBA1C 9.4 (H) 07/14/2019  On Metformin 1000 mg BID Advised to follow diabetic diet Not on statin, does not want to take additional medications F/u CMP and lipid panel Diabetic eye exam: Advised to follow up with Ophthalmology for diabetic eye exam       Relevant Orders   CBC with Differential/Platelet   CMP14+EGFR   Lipid panel   HgB A1c   Microalbumin, urine   Diabetic polyneuropathy associated with type 2 diabetes mellitus (HCC)    On Gabapentin Symptoms controlled currently      Relevant Medications   gabapentin (NEURONTIN) 300 MG capsule  Nervous and Auditory   Mixed conductive and sensorineural hearing loss of both ears    Needs hearing test and hearing aide        Other   Depression, major, single episode, moderate ([place])    [place] Office Visit from 11/13/2020 in [facility]  PHQ-9 Total Score 12  Discussed about treatment options, including medications and BH therapy, patient declined both.      Relevant Orders   TSH    Meds ordered this encounter  Medications   gabapentin (NEURONTIN) 300 MG capsule    Sig: Take 1 capsule (300 mg total) by mouth at bedtime.    Dispense:  90 capsule    Refill:  1    Follow-up: Return in about 4 months (around 03/16/2021) for Annual physical.    Lindell Spar, MD

## 2020-11-13 NOTE — Assessment & Plan Note (Signed)
Needs hearing test and hearing aide 

## 2020-11-14 ENCOUNTER — Other Ambulatory Visit: Payer: Self-pay | Admitting: Internal Medicine

## 2020-11-14 ENCOUNTER — Other Ambulatory Visit: Payer: Self-pay | Admitting: Nurse Practitioner

## 2020-11-14 DIAGNOSIS — E1140 Type 2 diabetes mellitus with diabetic neuropathy, unspecified: Secondary | ICD-10-CM

## 2020-11-14 DIAGNOSIS — E1142 Type 2 diabetes mellitus with diabetic polyneuropathy: Secondary | ICD-10-CM

## 2020-11-14 MED ORDER — EMPAGLIFLOZIN 10 MG PO TABS: 10.0000 mg | ORAL_TABLET | Freq: Every day | ORAL | 3 refills | Status: DC

## 2020-11-15 ENCOUNTER — Telehealth: Payer: Self-pay

## 2020-11-15 LAB — CMP14+EGFR
ALT: 40 IU/L (ref 0–44)
AST: 35 IU/L (ref 0–40)
Albumin/Globulin Ratio: 2.1 (ref 1.2–2.2)
Albumin: 4.4 g/dL (ref 3.6–4.6)
Alkaline Phosphatase: 73 IU/L (ref 44–121)
BUN/Creatinine Ratio: 14 (ref 10–24)
BUN: 15 mg/dL (ref 8–27)
Bilirubin Total: 0.9 mg/dL (ref 0.0–1.2)
CO2: 22 mmol/L (ref 20–29)
Calcium: 10 mg/dL (ref 8.6–10.2)
Chloride: 101 mmol/L (ref 96–106)
Creatinine, Ser: 1.05 mg/dL (ref 0.76–1.27)
Globulin, Total: 2.1 g/dL (ref 1.5–4.5)
Glucose: 182 mg/dL — ABNORMAL HIGH (ref 70–99)
Potassium: 4.6 mmol/L (ref 3.5–5.2)
Sodium: 140 mmol/L (ref 134–144)
Total Protein: 6.5 g/dL (ref 6.0–8.5)
eGFR: 69 mL/min/{1.73_m2} (ref >59)

## 2020-11-15 LAB — HEMOGLOBIN A1C
Est. average glucose Bld gHb Est-mCnc: 192 mg/dL
Hgb A1c MFr Bld: 8.3 % — ABNORMAL HIGH (ref 4.8–5.6)

## 2020-11-15 LAB — CBC WITH DIFFERENTIAL/PLATELET
Basophils Absolute: 0.1 10*3/uL (ref 0.0–0.2)
Basos: 1 %
EOS (ABSOLUTE): 0.2 10*3/uL (ref 0.0–0.4)
Eos: 2 %
Hematocrit: 43.9 % (ref 37.5–51.0)
Hemoglobin: 14.7 g/dL (ref 13.0–17.7)
Immature Grans (Abs): 0.1 10*3/uL (ref 0.0–0.1)
Immature Granulocytes: 1 %
Lymphocytes Absolute: 2.6 10*3/uL (ref 0.7–3.1)
Lymphs: 25 %
MCH: 29.3 pg (ref 26.6–33.0)
MCHC: 33.5 g/dL (ref 31.5–35.7)
MCV: 88 fL (ref 79–97)
Monocytes Absolute: 0.8 10*3/uL (ref 0.1–0.9)
Monocytes: 7 %
Neutrophils Absolute: 6.8 10*3/uL (ref 1.4–7.0)
Neutrophils: 64 %
Platelets: 214 10*3/uL (ref 150–450)
RBC: 5.02 x10E6/uL (ref 4.14–5.80)
RDW: 12.9 % (ref 11.6–15.4)
WBC: 10.5 10*3/uL (ref 3.4–10.8)

## 2020-11-15 LAB — LIPID PANEL
Chol/HDL Ratio: 4.2 ratio (ref 0.0–5.0)
Cholesterol, Total: 161 mg/dL (ref 100–199)
HDL: 38 mg/dL — ABNORMAL LOW (ref >39)
LDL Chol Calc (NIH): 88 mg/dL (ref 0–99)
Triglycerides: 209 mg/dL — ABNORMAL HIGH (ref 0–149)
VLDL Cholesterol Cal: 35 mg/dL (ref 5–40)

## 2020-11-15 LAB — TSH: TSH: 3.42 u[IU]/mL (ref 0.450–4.500)

## 2020-11-15 LAB — MICROALBUMIN, URINE: Microalbumin, Urine: 9.6 ug/mL

## 2020-11-15 NOTE — Telephone Encounter (Signed)
Pt advised of lab results.  

## 2020-11-15 NOTE — Telephone Encounter (Signed)
Patient returning call, call back on cell # [phone]

## 2021-02-02 ENCOUNTER — Other Ambulatory Visit: Payer: Self-pay | Admitting: Internal Medicine

## 2021-02-16 ENCOUNTER — Other Ambulatory Visit: Payer: Self-pay

## 2021-02-16 ENCOUNTER — Telehealth: Payer: Self-pay | Admitting: Internal Medicine

## 2021-02-16 DIAGNOSIS — E1140 Type 2 diabetes mellitus with diabetic neuropathy, unspecified: Secondary | ICD-10-CM

## 2021-02-16 MED ORDER — EMPAGLIFLOZIN 10 MG PO TABS: 10.0000 mg | ORAL_TABLET | Freq: Every day | ORAL | 3 refills | Status: DC

## 2021-02-16 NOTE — Telephone Encounter (Signed)
Refills sent

## 2021-02-16 NOTE — Telephone Encounter (Signed)
Pt called in for refill on   empagliflozin (JARDIANCE) 10 MG TABS tablet

## 2021-02-27 DIAGNOSIS — E119 Type 2 diabetes mellitus without complications: Secondary | ICD-10-CM | POA: Diagnosis not present

## 2021-02-27 LAB — HM DIABETES EYE EXAM

## 2021-03-19 ENCOUNTER — Ambulatory Visit (INDEPENDENT_AMBULATORY_CARE_PROVIDER_SITE_OTHER): Payer: Medicare Other | Admitting: Internal Medicine

## 2021-03-19 ENCOUNTER — Ambulatory Visit (INDEPENDENT_AMBULATORY_CARE_PROVIDER_SITE_OTHER): Payer: Medicare Other

## 2021-03-19 ENCOUNTER — Other Ambulatory Visit: Payer: Self-pay

## 2021-03-19 ENCOUNTER — Encounter: Payer: Self-pay | Admitting: Internal Medicine

## 2021-03-19 VITALS — BP 124/58 | HR 67 | Resp 18 | Ht 70.0 in | Wt 189.0 lb

## 2021-03-19 DIAGNOSIS — E1142 Type 2 diabetes mellitus with diabetic polyneuropathy: Secondary | ICD-10-CM | POA: Diagnosis not present

## 2021-03-19 DIAGNOSIS — E559 Vitamin D deficiency, unspecified: Secondary | ICD-10-CM

## 2021-03-19 DIAGNOSIS — Z2821 Immunization not carried out because of patient refusal: Secondary | ICD-10-CM

## 2021-03-19 DIAGNOSIS — Z0000 Encounter for general adult medical examination without abnormal findings: Secondary | ICD-10-CM | POA: Diagnosis not present

## 2021-03-19 DIAGNOSIS — Z0001 Encounter for general adult medical examination with abnormal findings: Secondary | ICD-10-CM

## 2021-03-19 DIAGNOSIS — E1140 Type 2 diabetes mellitus with diabetic neuropathy, unspecified: Secondary | ICD-10-CM | POA: Diagnosis not present

## 2021-03-19 DIAGNOSIS — J439 Emphysema, unspecified: Secondary | ICD-10-CM | POA: Diagnosis not present

## 2021-03-19 LAB — POCT GLYCOSYLATED HEMOGLOBIN (HGB A1C): HbA1c, POC (controlled diabetic range): 7.2 % — AB (ref 0.0–7.0)

## 2021-03-19 NOTE — Patient Instructions (Signed)

## 2021-03-19 NOTE — Patient Instructions (Signed)
Please continue taking medications as prescribed.  Please continue to follow low carb diet and ambulate as tolerated. 

## 2021-03-19 NOTE — Assessment & Plan Note (Signed)

## 2021-03-19 NOTE — Assessment & Plan Note (Signed)
Lab Results  Component Value Date   HGBA1C 7.2 (A) 03/19/2021   Well-controlled now On Metformin 1000 mg BID and Jardiance 10 mg QD Advised to follow diabetic diet Not on statin, does not want to take additional medications F/u CMP and lipid panel Diabetic eye exam: Advised to follow up with Ophthalmology for diabetic eye exam

## 2021-03-19 NOTE — Progress Notes (Signed)
Subjective:   Jeremy Leon is a 88 y.o. male who presents for Medicare Annual/Subsequent preventive examination.  Review of Systems    Defer to PCP Cardiac Risk Factors include: advanced age (>30men, >77 women);male gender;diabetes mellitus;obesity (BMI >30kg/m2);hypertension     Objective:    Today's Vitals   03/19/21 1414  PainSc: 0-No pain   There is no height or weight on file to calculate BMI.  Advanced Directives 03/19/2021 03/01/2020 08/12/2016 08/12/2016  Does Patient Have a Medical Advance Directive? Yes Yes No Yes  Type of Paramedic of [place];Living will [place];Living will [place];Living will Living will;Healthcare Power of [place] in Chart? No - copy requested No - copy requested No - copy requested No - copy requested  Would patient like information on creating a medical advance directive? - - No - Patient declined -    Current Medications (verified) Outpatient Encounter Medications as of 03/19/2021  Medication Sig   ACCU-CHEK GUIDE test strip USE AS DIRECTED TO TEST TWICE DAILY.   aspirin 81 MG tablet Take 81 mg by mouth daily.   cyanocobalamin 1000 MCG tablet Take 100 mcg by mouth daily.   empagliflozin (JARDIANCE) 10 MG TABS tablet Take 1 tablet (10 mg total) by mouth daily before breakfast.   Fluticasone-Umeclidin-Vilant (TRELEGY ELLIPTA) 100-62.5-25 MCG/INH AEPB Use I inh PO daily   gabapentin (NEURONTIN) 300 MG capsule TAKE 1 CAPSULE BY MOUTH EVERYDAY AT BEDTIME   Magnesium 250 MG TABS Take 1 tablet by mouth daily.   metFORMIN (GLUCOPHAGE) 1000 MG tablet TAKE 1 TABLET BY MOUTH  TWICE DAILY   Multiple Vitamins-Minerals (CENTRUM SILVER 50+MEN PO) Take 1 tablet by mouth daily.   Omega-3 Fatty Acids (FISH OIL) 1000 MG CPDR Take 1,000 mg by mouth.   Vitamin D-Vitamin K (D3 + K2 DOTS) 1000-90 UNIT-MCG TABS Take by mouth.   vitamin E 1000 UNIT capsule Take 1,000  Units by mouth daily.   No facility-administered encounter medications on file as of 03/19/2021.    Allergies (verified) Amoxicillin   History: Past Medical History:  Diagnosis Date   Borderline diabetes    COPD (chronic obstructive pulmonary disease) (HCC)    Decreased hearing of both ears    Diabetes mellitus without complication ([place])    History of cardiac catheterization    No significant CAD 2007   Past Surgical History:  Procedure Laterality Date   TONSILLECTOMY     VASECTOMY     Family History  Problem Relation Age of Onset   Stroke Mother    Colon cancer Father        Died age 56   Leukemia Brother        Died age 86   Diabetes Mellitus II Brother    Social History   Socioeconomic History   Marital status: Widowed    Spouse name: Not on file   Number of children: 2   Years of education: 72   Highest education level: Associate degree: occupational, Hotel manager, or vocational program  Occupational History   Occupation: retired  Tobacco Use   Smoking status: Former    Types: Cigarettes    Quit date: 02/11/1997    Years since quitting: 24.1   Smokeless tobacco: Never  Vaping Use   Vaping Use: Never used  Substance and Sexual Activity   Alcohol use: No    Alcohol/week: 0.0 standard drinks   Drug use: No   Sexual  activity: Yes  Other Topics Concern   Not on file  Social History Narrative         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   Social Determinants of Health   Financial Resource Strain: Low Risk    Difficulty of Paying Living Expenses: Not hard at all  Food Insecurity: No Food Insecurity   Worried About Charity fundraiser in the Last Year: Never true   [place] in the Last Year: Never true  Transportation Needs: No Transportation Needs   Lack of Transportation (Medical): No   Lack  of Transportation (Non-Medical): No  Physical Activity: Inactive   Days of Exercise per Week: 0 days   Minutes of Exercise per Session: 0 min  Stress: Stress Concern Present   Feeling of Stress : To some extent  Social Connections: Moderately Isolated   Frequency of Communication with Friends and Family: More than three times a week   Frequency of Social Gatherings with Friends and Family: Once a week   Attends Religious Services: Never   Marine scientist or Organizations: Yes   Attends Archivist Meetings: Never   Marital Status: Widowed    Tobacco Counseling Counseling given: Not Answered   Clinical Intake:  Pre-visit preparation completed: No  Pain : No/denies pain Pain Score: 0-No pain     Nutritional Risks: None Diabetes: Yes CBG done?: No Did pt. bring in CBG monitor from home?: No  How often do you need to have someone help you when you read instructions, pamphlets, or other written materials from your doctor or pharmacy?: 1 - Never What is the last grade level you completed in school?: 12th  Diabetic?Nutrition Risk Assessment:  Has the patient had any N/V/D within the last 2 months?  No  Does the patient have any non-healing wounds?  No  Has the patient had any unintentional weight loss or weight gain?  No   Diabetes:  Is the patient diabetic?  No  If diabetic, was a CBG obtained today?  No  Did the patient bring in their glucometer from home?  No  How often do you monitor your CBG's? Twice daily.   Financial Strains and Diabetes Management:  Are you having any financial strains with the device, your supplies or your medication? No .  Does the patient want to be seen by Chronic Care Management for management of their diabetes?  No  Would the patient like to be referred to a Nutritionist or for Diabetic Management?  No   Diabetic Exams:  Diabetic Eye Exam: Completed 02/2021 Diabetic Foot Exam: Overdue, Pt has been advised about the  importance in completing this exam. Pt is scheduled for diabetic foot exam on 03/19/2021.   Interpreter Needed?: No  Information entered by :: Judeen Hammans   Activities of Daily Living In your present state of health, do you have any difficulty performing the following activities: 03/19/2021  Hearing? N  Vision? N  Difficulty concentrating or making decisions? Y  Comment pt states forgets little things  Walking or climbing stairs? Y  Comment Pt states due to COPD  Dressing or bathing? N  Doing errands, shopping? N  Preparing Food and eating ? N  Using the Toilet?  N  In the past six months, have you accidently leaked urine? Y  Comment Pt states some dribble on the way to bathroom at times  Do you have problems with loss of bowel control? N  Managing your Medications? N  Managing your Finances? N  Housekeeping or managing your Housekeeping? N  Some recent data might be hidden    Patient Care Team: Lindell Spar, MD as PCP - General (Internal Medicine)  Indicate any recent Medical Services you may have received from other than [facility] providers in the past year (date may be approximate).     Assessment:   This is a routine wellness examination for Guillermo.  Hearing/Vision screen No results found.  Dietary issues and exercise activities discussed: Current Exercise Habits: The patient does not participate in regular exercise at present (Hopes to start exercising and getting outside as the wheather warms up), Exercise limited by: cardiac condition(s);respiratory conditions(s)   Goals Addressed   None   Depression Screen PHQ 2/9 Scores 03/19/2021 11/13/2020 03/01/2020 08/05/2019 01/12/2019  PHQ - 2 Score 0 6 0 0 0  PHQ- 9 Score 0 12 - 6 -    Fall Risk Fall Risk  03/19/2021 03/19/2021 11/13/2020 03/01/2020 08/05/2019  Falls in the past year? 0 0 0 0 0  Number falls in past yr: 0 0 0 0 0  Injury with Fall? 0 0 0 0 0  Risk for fall due to : - No Fall Risks No Fall Risks - No Fall Risks  Follow  up - Falls evaluation completed Falls evaluation completed Education provided Falls evaluation completed    FALL RISK PREVENTION PERTAINING TO THE HOME:  Any stairs in or around the home? Yes  If so, are there any without handrails? Yes  Home free of loose throw rugs in walkways, pet beds, electrical cords, etc? Yes  Adequate lighting in your home to reduce risk of falls? Yes   ASSISTIVE DEVICES UTILIZED TO PREVENT FALLS:  Life alert? No  Use of a cane, walker or w/c? No  Grab bars in the bathroom? Yes  Shower chair or bench in shower? Yes  Elevated toilet seat or a handicapped toilet? No    Cognitive Function:     6CIT Screen 03/19/2021 03/01/2020  What Year? 0 points 0 points  What month? 0 points 0 points  What time? 0 points 0 points  Count back from 20 0 points 0 points  Months in reverse 0 points 0 points  Repeat phrase 0 points 0 points  Total Score 0 0    Immunizations Immunization History  Administered Date(s) Administered   Janssen (J&J) SARS-COV-2 Vaccination 05/12/2019    TDAP status: Due, Education has been provided regarding the importance of this vaccine. Advised may receive this vaccine at local pharmacy or Health Dept. Aware to provide a copy of the vaccination record if obtained from local pharmacy or Health Dept. Verbalized acceptance and understanding.  Flu Vaccine status: Due, Education has been provided regarding the importance of this vaccine. Advised may receive this vaccine at local pharmacy or Health Dept. Aware to provide a copy of the vaccination record if obtained from local pharmacy or Health Dept. Verbalized acceptance and understanding.  Pneumococcal vaccine status: Due, Education has been provided regarding the importance of this vaccine. Advised may receive this vaccine at local pharmacy or Health Dept. Aware to provide a copy of the vaccination record if obtained from local pharmacy or Health Dept. Verbalized acceptance and  understanding.  Covid-19  vaccine status: Information provided on how to obtain vaccines.   Qualifies for Shingles Vaccine? Yes   Zostavax completed No   Shingrix Completed?: No.    Education has been provided regarding the importance of this vaccine. Patient has been advised to call insurance company to determine out of pocket expense if they have not yet received this vaccine. Advised may also receive vaccine at local pharmacy or Health Dept. Verbalized acceptance and understanding.  Screening Tests Health Maintenance  Topic Date Due   OPHTHALMOLOGY EXAM  Never done   TETANUS/TDAP  Never done   Zoster Vaccines- Shingrix (1 of 2) Never done   COVID-19 Vaccine (2 - Booster for Janssen series) 07/07/2019   INFLUENZA VACCINE  05/11/2021 (Originally 09/11/2020)   Pneumonia Vaccine 67+ Years old (1 - PCV) 03/19/2022 (Originally 07/09/1938)   HEMOGLOBIN A1C  09/16/2021   URINE MICROALBUMIN  11/13/2021   FOOT EXAM  03/19/2022   HPV VACCINES  Aged Out    Health Maintenance  Health Maintenance Due  Topic Date Due   OPHTHALMOLOGY EXAM  Never done   TETANUS/TDAP  Never done   Zoster Vaccines- Shingrix (1 of 2) Never done   COVID-19 Vaccine (2 - Booster for Janssen series) 07/07/2019    Colorectal cancer screening: No longer required.   Lung Cancer Screening: (Low Dose CT Chest recommended if Age 24-80 years, 30 pack-year currently smoking OR have quit w/in 15years.) does not qualify.   Lung Cancer Screening Referral: n/a  Additional Screening:  Hepatitis C Screening: does not qualify; Completed not at high risk  Vision Screening: Recommended annual ophthalmology exams for early detection of glaucoma and other disorders of the eye. Is the patient up to date with their annual eye exam?  Yes  Who is the provider or what is the name of the office in which the patient attends annual eye exams? [facility]  If pt is not established with a provider, would they like to be referred to a provider  to establish care? No .   Dental Screening: Recommended annual dental exams for proper oral hygiene  Community Resource Referral / Chronic Care Management: CRR required this visit?  No   CCM required this visit?  No      Plan:     I have personally reviewed and noted the following in the patients chart:   Medical and social history Use of alcohol, tobacco or illicit drugs  Current medications and supplements including opioid prescriptions. Patient is not currently taking opioid prescriptions. Functional ability and status Nutritional status Physical activity Advanced directives List of other physicians Hospitalizations, surgeries, and ER visits in previous 12 months Vitals Screenings to include cognitive, depression, and falls Referrals and appointments  In addition, I have reviewed and discussed with patient certain preventive protocols, quality metrics, and best practice recommendations. A written personalized care plan for preventive services as well as general preventive health recommendations were provided to patient.     Earline [place], [place]   03/19/2021   Nurse Notes:  Mr. Boice , Thank you for taking time to come for your Medicare Wellness Visit. I appreciate your ongoing commitment to your health goals. Please review the following plan we discussed and let me know if I can assist you in the future.   These are the goals we discussed:  Goals   None     This is a list of the screening recommended for you and due dates:  Health Maintenance  Topic Date Due  Eye exam for diabetics  Never done   Tetanus Vaccine  Never done   Zoster (Shingles) Vaccine (1 of 2) Never done   COVID-19 Vaccine (2 - Booster for Janssen series) 07/07/2019   Flu Shot  05/11/2021*   Pneumonia Vaccine (1 - PCV) 03/19/2022*   Hemoglobin A1C  09/16/2021   Urine Protein Check  11/13/2021   Complete foot exam   03/19/2022   HPV Vaccine  Aged Out  *Topic was postponed. The date shown  is not the original due date.

## 2021-03-19 NOTE — Assessment & Plan Note (Signed)
Well-controlled On Trelegy 

## 2021-03-19 NOTE — Progress Notes (Signed)
Established Patient Office Visit  Subjective:  Patient ID: Jeremy Leon, male    DOB: 1932-03-10  Age: 88 y.o. MRN: [id number]  CC:  Chief Complaint  Patient presents with   Annual Exam    Annual exam pt had diabetic readings     HPI Jeremy Leon is a 88 y.o. male with past medical history of type II DM with neuropathy, HTN and COPD who presents for annual physical.  Type II DM: His HbA1C has improved to 7.4 now.  He is taking metformin and Jardiance now.  His blood glucose ranges between 120-200 most of the time, depending on what he eats.  He admits that he needs to improve on his diet.  He denies any polyuria or polydipsia.  He also takes gabapentin for peripheral neuropathy.  HTN: BP is well-controlled. Takes medications regularly. Patient denies headache, dizziness, chest pain, dyspnea or palpitations.  He denies flu and pneumococcal vaccines today.  He is going to get cataract surgery.  Past Medical History:  Diagnosis Date   Borderline diabetes    COPD (chronic obstructive pulmonary disease) (HCC)    Decreased hearing of both ears    Diabetes mellitus without complication ([place])    History of cardiac catheterization    No significant CAD 2007    Past Surgical History:  Procedure Laterality Date   TONSILLECTOMY     VASECTOMY      Family History  Problem Relation Age of Onset   Colon cancer Father        Died age 46   Stroke Mother    Leukemia Brother        Died age 8   Diabetes Mellitus II Brother     Social History   Socioeconomic History   Marital status: Widowed    Spouse name: Not on file   Number of children: Not on file   Years of education: Not on file   Highest education level: Not on file  Occupational History   Occupation: retired  Tobacco Use   Smoking status: Former    Types: Cigarettes    Quit date: 02/11/1997    Years since quitting: 24.1   Smokeless tobacco: Never  Vaping Use   Vaping Use: Never used  Substance and  Sexual Activity   Alcohol use: No    Alcohol/week: 0.0 standard drinks   Drug use: No   Sexual activity: Yes  Other Topics Concern   Not on file  Social History Narrative         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   Social Determinants of Health   Financial Resource Strain: Not on file  Food Insecurity: Not on file  Transportation Needs: Not on file  Physical Activity: Not on file  Stress: Not on file  Social Connections: Not on file  Intimate Partner Violence: Not on file    Outpatient Medications Prior to Visit  Medication Sig Dispense Refill   ACCU-CHEK GUIDE test strip USE AS DIRECTED TO TEST TWICE DAILY. 100 strip 0   aspirin 81 MG tablet Take 81 mg by mouth daily.     cyanocobalamin 1000 MCG tablet Take 100 mcg by mouth daily.     empagliflozin (JARDIANCE) 10 MG TABS tablet  Take 1 tablet (10 mg total) by mouth daily before breakfast. 30 tablet 3   Fluticasone-Umeclidin-Vilant (TRELEGY ELLIPTA) 100-62.5-25 MCG/INH AEPB Use I inh PO daily 1 each 3   gabapentin (NEURONTIN) 300 MG capsule TAKE 1 CAPSULE BY MOUTH EVERYDAY AT BEDTIME 90 capsule 1   Magnesium 250 MG TABS Take 1 tablet by mouth daily.     metFORMIN (GLUCOPHAGE) 1000 MG tablet TAKE 1 TABLET BY MOUTH  TWICE DAILY 180 tablet 3   Multiple Vitamins-Minerals (CENTRUM SILVER 50+MEN PO) Take 1 tablet by mouth daily.     Omega-3 Fatty Acids (FISH OIL) 1000 MG CPDR Take 1,000 mg by mouth.     Vitamin D-Vitamin K (D3 + K2 DOTS) 1000-90 UNIT-MCG TABS Take by mouth.     vitamin E 1000 UNIT capsule Take 1,000 Units by mouth daily.     No facility-administered medications prior to visit.    Allergies  Allergen Reactions   Amoxicillin     Itching     ROS Review of Systems  Constitutional:  Positive for fatigue. Negative for chills and fever.  HENT:  Positive  for hearing loss. Negative for congestion and sore throat.   Eyes:  Negative for pain and discharge.  Respiratory:  Negative for cough and shortness of breath.   Cardiovascular:  Negative for chest pain and palpitations.  Gastrointestinal:  Negative for diarrhea, nausea and vomiting.  Endocrine: Negative for polydipsia and polyuria.  Genitourinary:  Negative for dysuria and hematuria.  Musculoskeletal:  Negative for neck pain and neck stiffness.  Skin:  Negative for rash.  Neurological:  Positive for numbness (B/l feet). Negative for dizziness and headaches.  Psychiatric/Behavioral:  Positive for dysphoric mood and sleep disturbance. Negative for agitation, behavioral problems, self-injury and suicidal ideas.      Objective:    Physical Exam Vitals reviewed.  Constitutional:      General: He is not in acute distress.    Appearance: He is not diaphoretic.  HENT:     Head: Normocephalic and atraumatic.     Nose: Nose normal.     Mouth/Throat:     Mouth: Mucous membranes are moist.  Eyes:     General: No scleral icterus.    Extraocular Movements: Extraocular movements intact.  Cardiovascular:     Rate and Rhythm: Normal rate and regular rhythm.     Pulses: Normal pulses.     Heart sounds: Normal heart sounds. No murmur heard. Pulmonary:     Breath sounds: Normal breath sounds. No wheezing or rales.  Abdominal:     Palpations: Abdomen is soft.     Tenderness: There is no abdominal tenderness.  Musculoskeletal:     Cervical back: Neck supple. No tenderness.     Right lower leg: No edema.     Left lower leg: No edema.  Skin:    General: Skin is warm.     Findings: No rash.  Neurological:     General: No focal deficit present.     Mental Status: He is alert and oriented to person, place, and time.     Cranial Nerves: No cranial nerve deficit.     Sensory: No sensory deficit.     Motor: No weakness.  Psychiatric:        Mood and Affect: Mood normal.        Behavior:  Behavior normal.    BP (!) 124/58 (BP Location: Right Arm, Patient Position: Sitting, Cuff Size: Normal)    Pulse 67  Resp 18    Ht 5' 10"  (1.778 m)    Wt 189 lb 0.6 oz (85.7 kg)    SpO2 94%    BMI 27.12 kg/m  Wt Readings from Last 3 Encounters:  03/19/21 189 lb 0.6 oz (85.7 kg)  11/13/20 201 lb (91.2 kg)  03/01/20 209 lb (94.8 kg)    Lab Results  Component Value Date   TSH 3.420 11/13/2020   Lab Results  Component Value Date   WBC 10.5 11/13/2020   HGB 14.7 11/13/2020   HCT 43.9 11/13/2020   MCV 88 11/13/2020   PLT 214 11/13/2020   Lab Results  Component Value Date   NA 140 11/13/2020   K 4.6 11/13/2020   CO2 22 11/13/2020   GLUCOSE 182 (H) 11/13/2020   BUN 15 11/13/2020   CREATININE 1.05 11/13/2020   BILITOT 0.9 11/13/2020   ALKPHOS 73 11/13/2020   AST 35 11/13/2020   ALT 40 11/13/2020   PROT 6.5 11/13/2020   ALBUMIN 4.4 11/13/2020   CALCIUM 10.0 11/13/2020   ANIONGAP 8 08/13/2016   EGFR 69 11/13/2020   Lab Results  Component Value Date   CHOL 161 11/13/2020   Lab Results  Component Value Date   HDL 38 (L) 11/13/2020   Lab Results  Component Value Date   LDLCALC 88 11/13/2020   Lab Results  Component Value Date   TRIG 209 (H) 11/13/2020   Lab Results  Component Value Date   CHOLHDL 4.2 11/13/2020   Lab Results  Component Value Date   HGBA1C 7.2 (A) 03/19/2021      Assessment & Plan:   Problem List Items Addressed This Visit       Encounter for general adult medical examination with abnormal findings - Primary   Physical exam as documented. Counseling done  re healthy lifestyle involving commitment to 150 minutes exercise per week, heart healthy diet, and attaining healthy weight.The importance of adequate sleep also discussed. Changes in health habits are decided on by the patient with goals and time frames  set for achieving them. Immunization and cancer screening needs are specifically addressed at this visit.     Relevant Orders   TSH  CMP14+EGFR  CBC with Differential/Platelet    Respiratory   Chronic obstructive pulmonary disease (HCC)    Well-controlled On Trelegy        Endocrine   Type 2 diabetes mellitus with diabetic neuropathy, without long-term current use of insulin (HCC)    Lab Results  Component Value Date   HGBA1C 7.2 (A) 03/19/2021  Well-controlled now On Metformin 1000 mg BID and Jardiance 10 mg QD Advised to follow diabetic diet Not on statin, does not want to take additional medications F/u CMP and lipid panel Diabetic eye exam: Advised to follow up with Ophthalmology for diabetic eye exam      Relevant Orders   POCT glycosylated hemoglobin (Hb A1C) (Completed)   Hemoglobin A1c   CMP14+EGFR   Diabetic polyneuropathy associated with type 2 diabetes mellitus ([place])    On Gabapentin Symptoms controlled currently      Other Visit Diagnoses     Influenza vaccine refused       Refused pneumococcal vaccine       Vitamin D deficiency       Relevant Orders   VITAMIN D 25 Hydroxy (Vit-D Deficiency, Fractures)       No orders of the defined types were placed in this encounter.   Follow-up: Return in about 6 months (  around 09/16/2021) for DM and HTN.    Lindell Spar, MD

## 2021-03-19 NOTE — Assessment & Plan Note (Signed)
On Gabapentin Symptoms controlled currently 

## 2021-03-22 ENCOUNTER — Encounter: Payer: Self-pay | Admitting: *Deleted

## 2021-04-09 ENCOUNTER — Other Ambulatory Visit: Payer: Self-pay | Admitting: *Deleted

## 2021-04-09 MED ORDER — METFORMIN HCL 1000 MG PO TABS: 1000.0000 mg | ORAL_TABLET | Freq: Two times a day (BID) | ORAL | 3 refills | Status: DC

## 2021-05-12 ENCOUNTER — Other Ambulatory Visit: Payer: Self-pay | Admitting: Internal Medicine

## 2021-06-07 DIAGNOSIS — H25813 Combined forms of age-related cataract, bilateral: Secondary | ICD-10-CM | POA: Diagnosis not present

## 2021-06-07 DIAGNOSIS — H01004 Unspecified blepharitis left upper eyelid: Secondary | ICD-10-CM | POA: Diagnosis not present

## 2021-06-07 DIAGNOSIS — H01002 Unspecified blepharitis right lower eyelid: Secondary | ICD-10-CM | POA: Diagnosis not present

## 2021-06-07 DIAGNOSIS — H01001 Unspecified blepharitis right upper eyelid: Secondary | ICD-10-CM | POA: Diagnosis not present

## 2021-06-07 DIAGNOSIS — H01005 Unspecified blepharitis left lower eyelid: Secondary | ICD-10-CM | POA: Diagnosis not present

## 2021-06-26 ENCOUNTER — Other Ambulatory Visit: Payer: Self-pay | Admitting: Internal Medicine

## 2021-06-26 DIAGNOSIS — E1142 Type 2 diabetes mellitus with diabetic polyneuropathy: Secondary | ICD-10-CM

## 2021-07-10 ENCOUNTER — Other Ambulatory Visit: Payer: Self-pay | Admitting: Internal Medicine

## 2021-07-10 DIAGNOSIS — E1140 Type 2 diabetes mellitus with diabetic neuropathy, unspecified: Secondary | ICD-10-CM

## 2021-07-16 ENCOUNTER — Telehealth: Payer: Self-pay

## 2021-07-16 DIAGNOSIS — H25811 Combined forms of age-related cataract, right eye: Secondary | ICD-10-CM | POA: Diagnosis not present

## 2021-07-16 NOTE — Telephone Encounter (Signed)
Aptient

## 2021-07-18 ENCOUNTER — Encounter (HOSPITAL_COMMUNITY)
Admission: RE | Admit: 2021-07-18 | Discharge: 2021-07-18 | Disposition: A | Discharge status: Home / Self Care | Payer: Medicare Other | Source: Ambulatory Visit | Attending: Ophthalmology | Admitting: Ophthalmology

## 2021-07-18 ENCOUNTER — Other Ambulatory Visit: Payer: Self-pay

## 2021-07-18 ENCOUNTER — Encounter (HOSPITAL_COMMUNITY): Payer: Self-pay

## 2021-07-19 NOTE — H&P (Signed)
Surgical History & Physical  Patient Name: Jeremy Leon DOB: 1932-12-05  Surgery: Cataract extraction with intraocular lens implant phacoemulsification; Right Eye  Surgeon: Fabio Pierce MD Surgery Date:  07-23-21 Pre-Op Date:  07-16-21  HPI: A 59 Yr. old male patient presents for a Cataract Eval, referred by Dr. Charise Killian. The patient complains of difficulty when viewing TV, reading closed caption, news scrolls on TV, which began many years ago. Both eyes are affected. The episode is constant and gradual. The condition's severity decreased since last visit. The complaint is associated with blurry vision. The patient is hard of hearing, and has to use closed captions on the television, but he can barely read them without straining. The patient also struggles with night time vision and difficulty with glare from headlights, and it blacks everything else out. This is negatively affecting the patient's quality of life and the patient is unable to function adequately in life with the current level of vision.The patient experiences no eye pain and no flashes, floater, shadow, curtain or veil. HPI was performed by Fabio Pierce .  Medical History: Diabetes  Review of Systems Negative Allergic/Immunologic Negative Cardiovascular Negative Constitutional Negative Ear, Nose, Mouth & Throat Negative Endocrine Negative Eyes Negative Gastrointestinal Negative Genitourinary Negative Hemotologic/Lymphatic Negative Integumentary Negative Musculoskeletal Negative Neurological Negative Psychiatry Negative Respiratory  Social   Never smoked   Medication Systane drops,  Metformin, Jardiance, Vitamin B-12, Vitamin D3, Vitamin E, Magnesium, Gabapentin,   Sx/Procedures  Vasectomy, Tonsilectomy,   Drug Allergies   NKDA  History & Physical: Heent: Cataract, right eye NECK: supple without bruits LUNGS: lungs clear to auscultation CV: regular rate and rhythm Abdomen: soft and  non-tender Impression & Plan: Assessment: 1.  COMBINED FORMS AGE RELATED CATARACT; Both Eyes (H25.813) 2.  BLEPHARITIS; Right Upper Lid, Right Lower Lid, Left Upper Lid, Left Lower Lid (H01.001, H01.002,H01.004,H01.005)  Plan: 1.  Cataract accounts for the patient's decreased vision. This visual impairment is not correctable with a tolerable change in glasses or contact lenses. Cataract surgery with an implantation of a new lens should significantly improve the visual and functional status of the patient. Discussed all risks, benefits, alternatives, and potential complications. Discussed the procedures and recovery. Patient desires to have surgery. A-scan ordered and performed today for intra-ocular lens calculations. The surgery will be performed in order to improve vision for driving, reading, and for eye examinations. Recommend phacoemulsification with intra-ocular lens. Recommend Dextenza for post-operative pain and inflammation. Right Eye worse - first. Dilates poorly - shugarcaine by protocol. Malyugin Ring. Omidira. Consider Synergy Lens.  2.  Recommend regular lid cleaning.

## 2021-07-23 ENCOUNTER — Ambulatory Visit (HOSPITAL_COMMUNITY): Payer: Medicare Other | Admitting: Anesthesiology

## 2021-07-23 ENCOUNTER — Encounter (HOSPITAL_COMMUNITY): Payer: Self-pay | Admitting: Ophthalmology

## 2021-07-23 ENCOUNTER — Ambulatory Visit (HOSPITAL_BASED_OUTPATIENT_CLINIC_OR_DEPARTMENT_OTHER): Payer: Medicare Other | Admitting: Anesthesiology

## 2021-07-23 ENCOUNTER — Ambulatory Visit (HOSPITAL_COMMUNITY)
Admission: RE | Admit: 2021-07-23 | Discharge: 2021-07-23 | Disposition: A | Discharge status: Home / Self Care | Payer: Medicare Other | Attending: Ophthalmology | Admitting: Ophthalmology

## 2021-07-23 ENCOUNTER — Encounter (HOSPITAL_COMMUNITY)
Admission: RE | Disposition: A | Discharge status: Home / Self Care | Payer: Self-pay | Source: Home / Self Care | Attending: Ophthalmology

## 2021-07-23 DIAGNOSIS — G709 Myoneural disorder, unspecified: Secondary | ICD-10-CM

## 2021-07-23 DIAGNOSIS — E1136 Type 2 diabetes mellitus with diabetic cataract: Secondary | ICD-10-CM | POA: Insufficient documentation

## 2021-07-23 DIAGNOSIS — H9190 Unspecified hearing loss, unspecified ear: Secondary | ICD-10-CM | POA: Insufficient documentation

## 2021-07-23 DIAGNOSIS — J449 Chronic obstructive pulmonary disease, unspecified: Secondary | ICD-10-CM | POA: Diagnosis not present

## 2021-07-23 DIAGNOSIS — H0100A Unspecified blepharitis right eye, upper and lower eyelids: Secondary | ICD-10-CM | POA: Insufficient documentation

## 2021-07-23 DIAGNOSIS — E119 Type 2 diabetes mellitus without complications: Secondary | ICD-10-CM

## 2021-07-23 DIAGNOSIS — Z87891 Personal history of nicotine dependence: Secondary | ICD-10-CM | POA: Insufficient documentation

## 2021-07-23 DIAGNOSIS — H0100B Unspecified blepharitis left eye, upper and lower eyelids: Secondary | ICD-10-CM | POA: Diagnosis not present

## 2021-07-23 DIAGNOSIS — Z7985 Long-term (current) use of injectable non-insulin antidiabetic drugs: Secondary | ICD-10-CM

## 2021-07-23 DIAGNOSIS — I10 Essential (primary) hypertension: Secondary | ICD-10-CM | POA: Insufficient documentation

## 2021-07-23 DIAGNOSIS — H25811 Combined forms of age-related cataract, right eye: Secondary | ICD-10-CM | POA: Diagnosis not present

## 2021-07-23 HISTORY — PX: CATARACT EXTRACTION W/PHACO: SHX586

## 2021-07-23 LAB — GLUCOSE, CAPILLARY: Glucose-Capillary: 165 mg/dL — ABNORMAL HIGH (ref 70–99)

## 2021-07-23 SURGERY — PHACOEMULSIFICATION, CATARACT, WITH IOL INSERTION
Anesthesia: Monitor Anesthesia Care | Site: Eye | Laterality: Right

## 2021-07-23 MED ORDER — BSS IO SOLN
INTRAOCULAR | Status: DC | PRN
  Administered 2021-07-23: 15 mL via INTRAOCULAR

## 2021-07-23 MED ORDER — PHENYLEPHRINE HCL 2.5 % OP SOLN
1.0000 [drp] | OPHTHALMIC | Status: AC | PRN
  Administered 2021-07-23 (×3): 1 [drp] via OPHTHALMIC

## 2021-07-23 MED ORDER — TETRACAINE HCL 0.5 % OP SOLN
1.0000 [drp] | OPHTHALMIC | Status: AC | PRN
  Administered 2021-07-23 (×3): 1 [drp] via OPHTHALMIC

## 2021-07-23 MED ORDER — POVIDONE-IODINE 5 % OP SOLN
OPHTHALMIC | Status: DC | PRN
  Administered 2021-07-23: 1 via OPHTHALMIC

## 2021-07-23 MED ORDER — EPINEPHRINE PF 1 MG/ML IJ SOLN
INTRAMUSCULAR | Status: AC
  Filled 2021-07-23: qty 2

## 2021-07-23 MED ORDER — SODIUM HYALURONATE 10 MG/ML IO SOLUTION
PREFILLED_SYRINGE | INTRAOCULAR | Status: DC | PRN
  Administered 2021-07-23: 0.85 mL via INTRAOCULAR

## 2021-07-23 MED ORDER — LIDOCAINE HCL 3.5 % OP GEL
1.0000 "application " | Freq: Once | OPHTHALMIC | Status: AC
  Administered 2021-07-23: 1 via OPHTHALMIC

## 2021-07-23 MED ORDER — NEOMYCIN-POLYMYXIN-DEXAMETH 3.5-10000-0.1 OP SUSP
OPHTHALMIC | Status: DC | PRN
  Administered 2021-07-23: 1 [drp] via OPHTHALMIC

## 2021-07-23 MED ORDER — TROPICAMIDE 1 % OP SOLN
1.0000 [drp] | OPHTHALMIC | Status: AC | PRN
  Administered 2021-07-23 (×3): 1 [drp] via OPHTHALMIC

## 2021-07-23 MED ORDER — STERILE WATER FOR IRRIGATION IR SOLN
Status: DC | PRN
  Administered 2021-07-23: 250 mL

## 2021-07-23 MED ORDER — LIDOCAINE 3.5 % OP GEL OPTIME - NO CHARGE
OPHTHALMIC | Status: DC | PRN
  Administered 2021-07-23: 1 [drp] via OPHTHALMIC

## 2021-07-23 MED ORDER — SODIUM HYALURONATE 23MG/ML IO SOSY
PREFILLED_SYRINGE | INTRAOCULAR | Status: DC | PRN
  Administered 2021-07-23: 0.6 mL via INTRAOCULAR

## 2021-07-23 MED ORDER — LIDOCAINE HCL (PF) 1 % IJ SOLN
INTRAOCULAR | Status: DC | PRN
  Administered 2021-07-23: 1 mL via OPHTHALMIC

## 2021-07-23 MED ORDER — PHENYLEPHRINE-KETOROLAC 1-0.3 % IO SOLN
INTRAOCULAR | Status: AC
  Filled 2021-07-23: qty 4

## 2021-07-23 MED ORDER — PHENYLEPHRINE-KETOROLAC 1-0.3 % IO SOLN
INTRAOCULAR | Status: DC | PRN
  Administered 2021-07-23: 500 mL via OPHTHALMIC

## 2021-07-23 SURGICAL SUPPLY — 13 items
CATARACT SUITE SIGHTPATH (MISCELLANEOUS) ×2 IMPLANT
CLOTH BEACON ORANGE TIMEOUT ST (SAFETY) ×2 IMPLANT
EYE SHIELD UNIVERSAL CLEAR (GAUZE/BANDAGES/DRESSINGS) ×1 IMPLANT
FEE CATARACT SUITE SIGHTPATH (MISCELLANEOUS) ×1 IMPLANT
GLOVE BIOGEL PI IND STRL 7.0 (GLOVE) ×2 IMPLANT
GLOVE BIOGEL PI INDICATOR 7.0 (GLOVE) ×2
LENS IOL DFR00V 19.5 ×2 IMPLANT
LENS IOL TECNIS SYNERGY 19.5 IMPLANT
PAD ARMBOARD 7.5X6 YLW CONV (MISCELLANEOUS) ×2 IMPLANT
SYR TB 1ML LL NO SAFETY (SYRINGE) ×2 IMPLANT
TAPE SURG TRANSPORE 1 IN (GAUZE/BANDAGES/DRESSINGS) IMPLANT
TAPE SURGICAL TRANSPORE 1 IN (GAUZE/BANDAGES/DRESSINGS) ×2
WATER STERILE IRR 250ML POUR (IV SOLUTION) ×2 IMPLANT

## 2021-07-23 NOTE — Op Note (Signed)
Date of procedure: 07/23/21  Pre-operative diagnosis:  Visually significant combined form age-related cataract, Right Eye (H25.811)  Post-operative diagnosis:  Visually significant combined form age-related cataract, Right Eye (H25.811)  Procedure: Removal of cataract via phacoemulsification and insertion of intra-ocular lens Wynetta Emery and Johnson DFR00V +19.5D into the capsular bag of the Right Eye  Attending surgeon: Gerda Diss. Masyn Fullam, MD, MA  Anesthesia: MAC, Topical Akten  Complications: None  Estimated Blood Loss: <32m (minimal)  Specimens: None  Implants: As above  Indications:  Visually significant age-related cataract, Right Eye  Procedure:  The patient was seen and identified in the pre-operative area. The operative eye was identified and dilated.  The operative eye was marked.  Topical anesthesia was administered to the operative eye.     The patient was then to the operative suite and placed in the supine position.  A timeout was performed confirming the patient, procedure to be performed, and all other relevant information.   The patient's face was prepped and draped in the usual fashion for intra-ocular surgery.  A lid speculum was placed into the operative eye and the surgical microscope moved into place and focused.  A superotemporal paracentesis was created using a 20 gauge paracentesis blade.  Shugarcaine was injected into the anterior chamber.  Viscoelastic was injected into the anterior chamber.  A temporal clear-corneal main wound incision was created using a 2.474mmicrokeratome.  A continuous curvilinear capsulorrhexis was initiated using an irrigating cystitome and completed using capsulorrhexis forceps.  Hydrodissection and hydrodeliniation were performed.  Viscoelastic was injected into the anterior chamber.  A phacoemulsification handpiece and a chopper as a second instrument were used to remove the nucleus and epinucleus. The irrigation/aspiration handpiece was used to  remove any remaining cortical material.   The capsular bag was reinflated with viscoelastic, checked, and found to be intact.  The intraocular lens was inserted into the capsular bag.  The irrigation/aspiration handpiece was used to remove any remaining viscoelastic.  The clear corneal wound and paracentesis wounds were then hydrated and checked with Weck-Cels to be watertight.  The lid-speculum was removed.  The drape was removed.  The patient's face was cleaned with a wet and dry 4x4.   Maxitrol was instilled in the eye. A clear shield was taped over the eye. The patient was taken to the post-operative care unit in good condition, having tolerated the procedure well.  Post-Op Instructions: [facility] and will receive all other orders and instructions.

## 2021-07-23 NOTE — Discharge Instructions (Addendum)
Please discharge patient when stable, will follow up today with Dr. Wrzosek at the [facility] office immediately following discharge.  Leave shield in place until visit.  All paperwork with discharge instructions will be given at the office.  [facility] Address:  [address]  [place], [place] [address]  

## 2021-07-23 NOTE — Interval H&P Note (Signed)
History and Physical Interval Note:  07/23/2021 9:05 AM  Jeremy Leon  has presented today for surgery, with the diagnosis of combined forms age related cataract; right.  The various methods of treatment have been discussed with the patient and family. After consideration of risks, benefits and other options for treatment, the patient has consented to  Procedure(s) with comments: CATARACT EXTRACTION PHACO AND INTRAOCULAR LENS PLACEMENT (IOC) (Right) - right as a surgical intervention.  The patient's history has been reviewed, patient examined, no change in status, stable for surgery.  I have reviewed the patient's chart and labs.  Questions were answered to the patient's satisfaction.     Jeremy Leon

## 2021-07-23 NOTE — Anesthesia Preprocedure Evaluation (Signed)
Anesthesia Evaluation  Patient identified by MRN, date of birth, ID band Patient awake    Reviewed: Allergy & Precautions, H&P , NPO status , Patient's Chart, lab work & pertinent test results, reviewed documented beta blocker date and time   Airway Mallampati: II  TM Distance: >3 FB Neck ROM: full    Dental no notable dental hx.    Pulmonary neg pulmonary ROS, former smoker,    Pulmonary exam normal breath sounds clear to auscultation       Cardiovascular Exercise Tolerance: Good hypertension,  Rhythm:regular Rate:Normal     Neuro/Psych PSYCHIATRIC DISORDERS Depression  Neuromuscular disease    GI/Hepatic negative GI ROS, Neg liver ROS,   Endo/Other  negative endocrine ROSdiabetes, Type 2  Renal/GU negative Renal ROS  negative genitourinary   Musculoskeletal   Abdominal   Peds  Hematology negative hematology ROS (+)   Anesthesia Other Findings   Reproductive/Obstetrics negative OB ROS                             Anesthesia Physical Anesthesia Plan  ASA: 3  Anesthesia Plan: MAC   Post-op Pain Management:    Induction:   PONV Risk Score and Plan:   Airway Management Planned:   Additional Equipment:   Intra-op Plan:   Post-operative Plan:   Informed Consent: I have reviewed the patients History and Physical, chart, labs and discussed the procedure including the risks, benefits and alternatives for the proposed anesthesia with the patient or authorized representative who has indicated his/her understanding and acceptance.     Dental Advisory Given  Plan Discussed with: CRNA  Anesthesia Plan Comments:         Anesthesia Quick Evaluation

## 2021-07-23 NOTE — Anesthesia Postprocedure Evaluation (Signed)
Anesthesia Post Note  Patient: Jeremy Leon  Procedure(s) Performed: CATARACT EXTRACTION PHACO AND INTRAOCULAR LENS PLACEMENT (IOC) (Right: Eye)  Patient location during evaluation: Phase II Anesthesia Type: MAC Level of consciousness: awake Pain management: pain level controlled Vital Signs Assessment: post-procedure vital signs reviewed and stable Respiratory status: spontaneous breathing and respiratory function stable Cardiovascular status: blood pressure returned to baseline and stable Postop Assessment: no headache and no apparent nausea or vomiting Anesthetic complications: no Comments: Late entry   No notable events documented.   Last Vitals:  Vitals:   07/23/21 0816 07/23/21 0948  BP: 125/70 128/69  Pulse: 77 73  Resp: 18 18  Temp: 36.9 C 36.5 C  SpO2: 93% 95%    Last Pain:  Vitals:   07/23/21 0948  TempSrc: Oral  PainSc: 0-No pain                 Louann Sjogren

## 2021-07-23 NOTE — Transfer of Care (Signed)
Immediate Anesthesia Transfer of Care Note  Patient: Jeremy Leon  Procedure(s) Performed: CATARACT EXTRACTION PHACO AND INTRAOCULAR LENS PLACEMENT (IOC) (Right: Eye)  Patient Location: Short Stay  Anesthesia Type:MAC  Level of Consciousness: awake, alert  and oriented  Airway & Oxygen Therapy: Patient Spontanous Breathing  Post-op Assessment: Report given to RN and Post -op Vital signs reviewed and stable  Post vital signs: Reviewed and stable  Last Vitals:  Vitals Value Taken Time  BP    Temp    Pulse    Resp    SpO2      Last Pain:  Vitals:   07/23/21 0816  TempSrc: Oral  PainSc: 0-No pain         Complications: No notable events documented.

## 2021-07-23 NOTE — Anesthesia Procedure Notes (Signed)
Procedure Name: MAC Date/Time: 07/23/2021 9:39 AM  Performed by: Vista Deck, CRNAPre-anesthesia Checklist: Patient identified, Emergency Drugs available, Suction available and Patient being monitored Patient Re-evaluated:Patient Re-evaluated prior to induction Oxygen Delivery Method: Nasal cannula Placement Confirmation: positive ETCO2

## 2021-07-25 ENCOUNTER — Encounter (HOSPITAL_COMMUNITY)
Admission: RE | Admit: 2021-07-25 | Discharge: 2021-07-25 | Disposition: A | Discharge status: Home / Self Care | Payer: Medicare Other | Source: Ambulatory Visit | Attending: Ophthalmology | Admitting: Ophthalmology

## 2021-07-26 ENCOUNTER — Encounter (HOSPITAL_COMMUNITY): Payer: Self-pay | Admitting: Ophthalmology

## 2021-07-30 DIAGNOSIS — H25812 Combined forms of age-related cataract, left eye: Secondary | ICD-10-CM | POA: Diagnosis not present

## 2021-07-31 NOTE — H&P (Signed)
Surgical History & Physical  Patient Name: Jeremy Leon DOB: Aug 24, 1932  Surgery: Cataract extraction with intraocular lens implant phacoemulsification; Left Eye  Surgeon: Fabio Pierce MD Surgery Date:  08-06-21 Pre-Op Date:  07-30-21  HPI: A 27 Yr. old male patient 1. The patient is returning after cataract post-op. The right eye is affected. Status post cataract post-op, which began 1 week ago: Since the last visit, the affected area is doing well. The patient's vision is good, colors are bright but hard focus per pt. Patient is following medication instructions. 2. 2. The patient complains of difficulty when viewing TV, reading closed caption, news scrolls on TV. The left eye is affected. The episode is gradual. The condition's severity increased since last visit. This is negatively affecting the patient's quality of life and the patient is unable to function adequately in life with the current level of vision. HPI was performed by Fabio Pierce .  Medical History: Cataract Diabetes  Review of Systems Negative Allergic/Immunologic Negative Cardiovascular Negative Constitutional Negative Ear, Nose, Mouth & Throat Negative Endocrine Negative Eyes Negative Gastrointestinal Negative Genitourinary Negative Hemotologic/Lymphatic Negative Integumentary Negative Musculoskeletal Negative Neurological Negative Psychiatry Negative Respiratory  Social   Never smoked   Medication Systane drops, Prednisolone-Moxifloxacin-Bromfenac,  Metformin, Jardiance, Vitamin B-12, Vitamin D3, Vitamin E, Magnesium, Gabapentin,   Sx/Procedures Phaco c IOL OD-Multi focal,  Vasectomy, Tonsilectomy,   Drug Allergies   NKDA  History & Physical: Heent: Cataract, left eye NECK: supple without bruits LUNGS: lungs clear to auscultation CV: regular rate and rhythm Abdomen: soft and non-tender Impression & Plan: Assessment: 1.  CATARACT EXTRACTION STATUS; Right Eye (Z98.41) 2.  COMBINED FORMS  AGE RELATED CATARACT; Left Eye (H25.812)  Plan: 1.  1 week after cataract surgery. Doing well with improved vision and normal eye pressure. Call with any problems or concerns. Continue Pred-Moxi-Brom 2x/day for 3 more weeks.  2.  Cataract accounts for the patient's decreased vision. This visual impairment is not correctable with a tolerable change in glasses or contact lenses. Cataract surgery with an implantation of a new lens should significantly improve the visual and functional status of the patient. Discussed all risks, benefits, alternatives, and potential complications. Discussed the procedures and recovery. Patient desires to have surgery. A-scan ordered and performed today for intra-ocular lens calculations. The surgery will be performed in order to improve vision for driving, reading, and for eye examinations. Recommend phacoemulsification with intra-ocular lens. Recommend Dextenza for post-operative pain and inflammation. Left Eye. Surgery required to correct imbalance of vision. Dilates poorly - shugarcaine by protocol. Malyugin Ring. Omidira.

## 2021-08-02 ENCOUNTER — Other Ambulatory Visit: Payer: Self-pay | Admitting: Internal Medicine

## 2021-08-06 ENCOUNTER — Ambulatory Visit (HOSPITAL_COMMUNITY): Payer: Medicare Other | Admitting: Anesthesiology

## 2021-08-06 ENCOUNTER — Ambulatory Visit (HOSPITAL_BASED_OUTPATIENT_CLINIC_OR_DEPARTMENT_OTHER): Payer: Medicare Other | Admitting: Anesthesiology

## 2021-08-06 ENCOUNTER — Ambulatory Visit (HOSPITAL_COMMUNITY)
Admission: RE | Admit: 2021-08-06 | Discharge: 2021-08-06 | Disposition: A | Discharge status: Home / Self Care | Payer: Medicare Other | Attending: Ophthalmology | Admitting: Ophthalmology

## 2021-08-06 ENCOUNTER — Encounter (HOSPITAL_COMMUNITY)
Admission: RE | Disposition: A | Discharge status: Home / Self Care | Payer: Self-pay | Source: Home / Self Care | Attending: Ophthalmology

## 2021-08-06 ENCOUNTER — Encounter (HOSPITAL_COMMUNITY): Payer: Self-pay | Admitting: Ophthalmology

## 2021-08-06 DIAGNOSIS — G709 Myoneural disorder, unspecified: Secondary | ICD-10-CM | POA: Diagnosis not present

## 2021-08-06 DIAGNOSIS — Z87891 Personal history of nicotine dependence: Secondary | ICD-10-CM | POA: Diagnosis not present

## 2021-08-06 DIAGNOSIS — H25812 Combined forms of age-related cataract, left eye: Secondary | ICD-10-CM | POA: Insufficient documentation

## 2021-08-06 DIAGNOSIS — E1136 Type 2 diabetes mellitus with diabetic cataract: Secondary | ICD-10-CM | POA: Diagnosis not present

## 2021-08-06 DIAGNOSIS — Z7984 Long term (current) use of oral hypoglycemic drugs: Secondary | ICD-10-CM

## 2021-08-06 DIAGNOSIS — I10 Essential (primary) hypertension: Secondary | ICD-10-CM | POA: Diagnosis not present

## 2021-08-06 DIAGNOSIS — E119 Type 2 diabetes mellitus without complications: Secondary | ICD-10-CM

## 2021-08-06 DIAGNOSIS — J449 Chronic obstructive pulmonary disease, unspecified: Secondary | ICD-10-CM | POA: Diagnosis not present

## 2021-08-06 HISTORY — PX: CATARACT EXTRACTION W/PHACO: SHX586

## 2021-08-06 LAB — GLUCOSE, CAPILLARY: Glucose-Capillary: 162 mg/dL — ABNORMAL HIGH (ref 70–99)

## 2021-08-06 SURGERY — PHACOEMULSIFICATION, CATARACT, WITH IOL INSERTION
Anesthesia: Monitor Anesthesia Care | Site: Eye | Laterality: Left

## 2021-08-06 MED ORDER — NEOMYCIN-POLYMYXIN-DEXAMETH 3.5-10000-0.1 OP SUSP
OPHTHALMIC | Status: DC | PRN
  Administered 2021-08-06: 1 [drp] via OPHTHALMIC

## 2021-08-06 MED ORDER — TROPICAMIDE 1 % OP SOLN
1.0000 [drp] | OPHTHALMIC | Status: AC | PRN
Start: 2021-08-06 — End: 2021-08-06
  Administered 2021-08-06 (×3): 1 [drp] via OPHTHALMIC

## 2021-08-06 MED ORDER — POVIDONE-IODINE 5 % OP SOLN
OPHTHALMIC | Status: DC | PRN
  Administered 2021-08-06: 1 via OPHTHALMIC

## 2021-08-06 MED ORDER — SODIUM HYALURONATE 10 MG/ML IO SOLUTION
PREFILLED_SYRINGE | INTRAOCULAR | Status: DC | PRN
  Administered 2021-08-06: 0.85 mL via INTRAOCULAR

## 2021-08-06 MED ORDER — MIDAZOLAM HCL 2 MG/2ML IJ SOLN
INTRAMUSCULAR | Status: AC
  Filled 2021-08-06: qty 2

## 2021-08-06 MED ORDER — SODIUM HYALURONATE 23MG/ML IO SOSY
PREFILLED_SYRINGE | INTRAOCULAR | Status: DC | PRN
  Administered 2021-08-06: 0.6 mL via INTRAOCULAR

## 2021-08-06 MED ORDER — TETRACAINE HCL 0.5 % OP SOLN
1.0000 [drp] | OPHTHALMIC | Status: AC | PRN
  Administered 2021-08-06 (×3): 1 [drp] via OPHTHALMIC

## 2021-08-06 MED ORDER — SODIUM CHLORIDE 0.9% FLUSH: INTRAVENOUS | Status: DC | PRN

## 2021-08-06 MED ORDER — MIDAZOLAM HCL 5 MG/5ML IJ SOLN: INTRAMUSCULAR | Status: DC | PRN

## 2021-08-06 MED ORDER — FENTANYL CITRATE (PF) 100 MCG/2ML IJ SOLN
INTRAMUSCULAR | Status: AC
  Filled 2021-08-06: qty 2

## 2021-08-06 MED ORDER — PHENYLEPHRINE-KETOROLAC 1-0.3 % IO SOLN
INTRAOCULAR | Status: DC | PRN
  Administered 2021-08-06: 500 mL via OPHTHALMIC

## 2021-08-06 MED ORDER — LIDOCAINE HCL 3.5 % OP GEL
1.0000 | Freq: Once | OPHTHALMIC | Status: AC
  Administered 2021-08-06: 1 via OPHTHALMIC

## 2021-08-06 MED ORDER — BSS IO SOLN
INTRAOCULAR | Status: DC | PRN
  Administered 2021-08-06: 15 mL via INTRAOCULAR

## 2021-08-06 MED ORDER — STERILE WATER FOR IRRIGATION IR SOLN
Status: DC | PRN
  Administered 2021-08-06: 250 mL

## 2021-08-06 MED ORDER — FENTANYL CITRATE (PF) 100 MCG/2ML IJ SOLN: INTRAMUSCULAR | Status: DC | PRN

## 2021-08-06 MED ORDER — LACTATED RINGERS IV SOLN: INTRAVENOUS | Status: DC

## 2021-08-06 MED ORDER — PHENYLEPHRINE-KETOROLAC 1-0.3 % IO SOLN
INTRAOCULAR | Status: AC
  Filled 2021-08-06: qty 4

## 2021-08-06 MED ORDER — PHENYLEPHRINE HCL 2.5 % OP SOLN
1.0000 [drp] | OPHTHALMIC | Status: AC | PRN
  Administered 2021-08-06 (×3): 1 [drp] via OPHTHALMIC

## 2021-08-06 MED ORDER — LIDOCAINE HCL (PF) 1 % IJ SOLN
INTRAOCULAR | Status: DC | PRN
  Administered 2021-08-06: 1 mL via OPHTHALMIC

## 2021-08-06 SURGICAL SUPPLY — 18 items
CATARACT SUITE SIGHTPATH (MISCELLANEOUS) ×2 IMPLANT
CLOTH BEACON ORANGE TIMEOUT ST (SAFETY) ×2 IMPLANT
EYE SHIELD UNIVERSAL CLEAR (GAUZE/BANDAGES/DRESSINGS) ×1 IMPLANT
FEE CATARACT SUITE SIGHTPATH (MISCELLANEOUS) ×1 IMPLANT
GLOVE BIOGEL PI IND STRL 7.0 (GLOVE) ×2 IMPLANT
GLOVE BIOGEL PI IND STRL 8.5 (GLOVE) IMPLANT
GLOVE BIOGEL PI INDICATOR 7.0 (GLOVE) ×2
GLOVE BIOGEL PI INDICATOR 8.5 (GLOVE) ×1
GOWN STRL REUS W/TWL XL LVL3 (GOWN DISPOSABLE) ×1 IMPLANT
LENS IOL TECNIS SYNERGY 20.0 ×1 IMPLANT
NDL HYPO 18GX1.5 BLUNT FILL (NEEDLE) IMPLANT
NEEDLE HYPO 18GX1.5 BLUNT FILL (NEEDLE) ×4 IMPLANT
PAD ARMBOARD 7.5X6 YLW CONV (MISCELLANEOUS) ×2 IMPLANT
RING MALYGIN 7.0 (MISCELLANEOUS) IMPLANT
SYR TB 1ML LL NO SAFETY (SYRINGE) ×2 IMPLANT
TAPE SURG TRANSPORE 1 IN (GAUZE/BANDAGES/DRESSINGS) IMPLANT
TAPE SURGICAL TRANSPORE 1 IN (GAUZE/BANDAGES/DRESSINGS) ×2
WATER STERILE IRR 250ML POUR (IV SOLUTION) ×2 IMPLANT

## 2021-08-06 NOTE — Interval H&P Note (Signed)
History and Physical Interval Note:  08/06/2021 10:36 AM  Jeremy Leon  has presented today for surgery, with the diagnosis of combined forms age related cataract; left.  The various methods of treatment have been discussed with the patient and family. After consideration of risks, benefits and other options for treatment, the patient has consented to  Procedure(s) with comments: CATARACT EXTRACTION PHACO AND INTRAOCULAR LENS PLACEMENT (IOC) (Left) - left as a surgical intervention.  The patient's history has been reviewed, patient examined, no change in status, stable for surgery.  I have reviewed the patient's chart and labs.  Questions were answered to the patient's satisfaction.     Fabio Pierce

## 2021-08-09 ENCOUNTER — Encounter (HOSPITAL_COMMUNITY): Payer: Self-pay | Admitting: Ophthalmology

## 2021-09-10 DIAGNOSIS — E559 Vitamin D deficiency, unspecified: Secondary | ICD-10-CM | POA: Diagnosis not present

## 2021-09-10 DIAGNOSIS — E1140 Type 2 diabetes mellitus with diabetic neuropathy, unspecified: Secondary | ICD-10-CM | POA: Diagnosis not present

## 2021-09-10 DIAGNOSIS — Z0001 Encounter for general adult medical examination with abnormal findings: Secondary | ICD-10-CM | POA: Diagnosis not present

## 2021-09-11 LAB — CBC WITH DIFFERENTIAL/PLATELET
Basophils Absolute: 0 10*3/uL (ref 0.0–0.2)
Basos: 0 %
EOS (ABSOLUTE): 0.2 10*3/uL (ref 0.0–0.4)
Eos: 2 %
Hematocrit: 49 % (ref 37.5–51.0)
Hemoglobin: 16.5 g/dL (ref 13.0–17.7)
Immature Grans (Abs): 0.1 10*3/uL (ref 0.0–0.1)
Immature Granulocytes: 1 %
Lymphocytes Absolute: 2.1 10*3/uL (ref 0.7–3.1)
Lymphs: 24 %
MCH: 28.9 pg (ref 26.6–33.0)
MCHC: 33.7 g/dL (ref 31.5–35.7)
MCV: 86 fL (ref 79–97)
Monocytes Absolute: 0.7 10*3/uL (ref 0.1–0.9)
Monocytes: 9 %
Neutrophils Absolute: 5.6 10*3/uL (ref 1.4–7.0)
Neutrophils: 64 %
Platelets: 231 10*3/uL (ref 150–450)
RBC: 5.71 x10E6/uL (ref 4.14–5.80)
RDW: 12.7 % (ref 11.6–15.4)
WBC: 8.7 10*3/uL (ref 3.4–10.8)

## 2021-09-11 LAB — CMP14+EGFR
ALT: 27 IU/L (ref 0–44)
AST: 20 IU/L (ref 0–40)
Albumin/Globulin Ratio: 2.2 (ref 1.2–2.2)
Albumin: 4.7 g/dL (ref 3.7–4.7)
Alkaline Phosphatase: 83 IU/L (ref 44–121)
BUN/Creatinine Ratio: 20 (ref 10–24)
BUN: 23 mg/dL (ref 8–27)
Bilirubin Total: 0.6 mg/dL (ref 0.0–1.2)
CO2: 22 mmol/L (ref 20–29)
Calcium: 10.1 mg/dL (ref 8.6–10.2)
Chloride: 101 mmol/L (ref 96–106)
Creatinine, Ser: 1.16 mg/dL (ref 0.76–1.27)
Globulin, Total: 2.1 g/dL (ref 1.5–4.5)
Glucose: 201 mg/dL — ABNORMAL HIGH (ref 70–99)
Potassium: 4.6 mmol/L (ref 3.5–5.2)
Sodium: 142 mmol/L (ref 134–144)
Total Protein: 6.8 g/dL (ref 6.0–8.5)
eGFR: 61 mL/min/{1.73_m2} (ref >59)

## 2021-09-11 LAB — HEMOGLOBIN A1C
Est. average glucose Bld gHb Est-mCnc: 163 mg/dL
Hgb A1c MFr Bld: 7.3 % — ABNORMAL HIGH (ref 4.8–5.6)

## 2021-09-11 LAB — VITAMIN D 25 HYDROXY (VIT D DEFICIENCY, FRACTURES): Vit D, 25-Hydroxy: 32.2 ng/mL (ref 30.0–100.0)

## 2021-09-11 LAB — TSH: TSH: 3.24 u[IU]/mL (ref 0.450–4.500)

## 2021-09-17 ENCOUNTER — Encounter: Payer: Self-pay | Admitting: Internal Medicine

## 2021-09-17 ENCOUNTER — Ambulatory Visit (INDEPENDENT_AMBULATORY_CARE_PROVIDER_SITE_OTHER): Payer: Medicare Other | Admitting: Internal Medicine

## 2021-09-17 VITALS — BP 122/72 | HR 74 | Resp 16 | Ht 70.0 in | Wt 190.0 lb

## 2021-09-17 DIAGNOSIS — E559 Vitamin D deficiency, unspecified: Secondary | ICD-10-CM | POA: Diagnosis not present

## 2021-09-17 DIAGNOSIS — N401 Enlarged prostate with lower urinary tract symptoms: Secondary | ICD-10-CM

## 2021-09-17 DIAGNOSIS — I10 Essential (primary) hypertension: Secondary | ICD-10-CM

## 2021-09-17 DIAGNOSIS — E782 Mixed hyperlipidemia: Secondary | ICD-10-CM | POA: Diagnosis not present

## 2021-09-17 DIAGNOSIS — E1140 Type 2 diabetes mellitus with diabetic neuropathy, unspecified: Secondary | ICD-10-CM

## 2021-09-17 DIAGNOSIS — J439 Emphysema, unspecified: Secondary | ICD-10-CM

## 2021-09-17 DIAGNOSIS — E1142 Type 2 diabetes mellitus with diabetic polyneuropathy: Secondary | ICD-10-CM | POA: Diagnosis not present

## 2021-09-17 DIAGNOSIS — R351 Nocturia: Secondary | ICD-10-CM

## 2021-09-17 MED ORDER — TRELEGY ELLIPTA 100-62.5-25 MCG/ACT IN AEPB: 1.0000 | INHALATION_SPRAY | Freq: Every day | RESPIRATORY_TRACT | 11 refills | Status: AC

## 2021-09-17 MED ORDER — TAMSULOSIN HCL 0.4 MG PO CAPS: 0.4000 mg | ORAL_CAPSULE | Freq: Every day | ORAL | 3 refills | Status: DC

## 2021-09-17 MED ORDER — EMPAGLIFLOZIN 10 MG PO TABS: 10.0000 mg | ORAL_TABLET | Freq: Every day | ORAL | 3 refills | Status: DC

## 2021-09-17 NOTE — Patient Instructions (Signed)
Please continue taking medications as prescribed.  Please continue to follow low carb diet and ambulate as tolerated.  Please get fasting blood tests done before the next visit. 

## 2021-09-18 NOTE — Assessment & Plan Note (Signed)
On Flomax, refilled. 

## 2021-09-18 NOTE — Assessment & Plan Note (Signed)
BP Readings from Last 1 Encounters:  09/17/21 122/72   Well-controlled currently, not on any medications currently Advised DASH diet and moderate exercise/walking as tolerated for now

## 2021-09-18 NOTE — Assessment & Plan Note (Signed)
Lab Results  Component Value Date   HGBA1C 7.3 (H) 09/10/2021   Well-controlled now On Metformin 1000 mg BID and Jardiance 10 mg QD Advised to follow diabetic diet Not on statin, does not want to take additional medications F/u CMP and lipid panel Diabetic eye exam: Advised to follow up with Ophthalmology for diabetic eye exam

## 2021-09-18 NOTE — Assessment & Plan Note (Addendum)
Well-controlled On Trelegy, uses it PRN, advised to use it QD Does not want to have rescue inhaler despite explaining its role 

## 2021-09-18 NOTE — Progress Notes (Signed)
Established Patient Office Visit  Subjective:  Patient ID: Jeremy Leon, male    DOB: 1932/12/04  Age: 88 y.o. MRN: [id number]  CC:  Chief Complaint  Patient presents with   Follow-up    6 month follow up HTN and DM     HPI Jeremy Leon is a 88 y.o. male with past medical history of type II DM with neuropathy, HTN and COPD who presents for f/u of his chronic medical conditions.  Type II DM: His HbA1C has improved to 7.3 now.  He is taking metformin and Jardiance now.  His blood glucose ranges between 120-200 most of the time, depending on what he eats.  He admits that he needs to improve on his diet.  He denies any polyuria or polydipsia.  He also takes gabapentin for peripheral neuropathy.  HTN: BP is well-controlled. Takes medications regularly. Patient denies headache, dizziness, chest pain, dyspnea or palpitations.   He denies flu and pneumococcal vaccines today.  Past Medical History:  Diagnosis Date   Borderline diabetes    COPD (chronic obstructive pulmonary disease) (HCC)    Decreased hearing of both ears    Diabetes mellitus without complication ([place])    History of cardiac catheterization    No significant CAD 2007    Past Surgical History:  Procedure Laterality Date   CARDIAC CATHETERIZATION     CATARACT EXTRACTION W/PHACO Right 07/23/2021   Procedure: CATARACT EXTRACTION PHACO AND INTRAOCULAR LENS PLACEMENT ([place]);  Surgeon: Baruch Goldmann, MD;  Location: [facility];  Service: Ophthalmology;  Laterality: Right;  CDE 16.63   CATARACT EXTRACTION W/PHACO Left 08/06/2021   Procedure: CATARACT EXTRACTION PHACO AND INTRAOCULAR LENS PLACEMENT ([facility]);  Surgeon: Baruch Goldmann, MD;  Location: [facility];  Service: Ophthalmology;  Laterality: Left;  CDE 16.65   TONSILLECTOMY     VASECTOMY      Family History  Problem Relation Age of Onset   Stroke Mother    Colon cancer Father        Died age 8   Leukemia Brother        Died age 3   Diabetes Mellitus II Brother      Social History   Socioeconomic History   Marital status: Married    Spouse name: Not on file   Number of children: 2   Years of education: 12   Highest education level: Associate degree: occupational, Hotel manager, or vocational program  Occupational History   Occupation: retired  Tobacco Use   Smoking status: Former    Packs/day: 1.00    Years: 30.00    Total pack years: 30.00    Types: Cigarettes    Quit date: 02/11/1997    Years since quitting: 24.6   Smokeless tobacco: Never  Vaping Use   Vaping Use: Never used  Substance and Sexual Activity   Alcohol use: No    Alcohol/week: 0.0 standard drinks of alcohol   Drug use: No   Sexual activity: Yes  Other Topics Concern   Not on file  Social History Narrative         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   Social Determinants of Health   Financial Resource Strain: Low Risk  (03/19/2021)   Overall Emergency planning/management officer Strain (  CARDIA)    Difficulty of Paying Living Expenses: Not hard at all  Food Insecurity: No Food Insecurity (03/19/2021)   Hunger Vital Sign    Worried About Running Out of Food in the Last Year: Never true    Ran Out of Food in the Last Year: Never true  Transportation Needs: No Transportation Needs (03/19/2021)   PRAPARE - Hydrologist (Medical): No    Lack of Transportation (Non-Medical): No  Physical Activity: Inactive (03/19/2021)   Exercise Vital Sign    Days of Exercise per Week: 0 days    Minutes of Exercise per Session: 0 min  Stress: Stress Concern Present (03/19/2021)   [place]    Feeling of Stress : To some extent  Social Connections: Moderately Isolated (03/19/2021)   Social Connection and Isolation Panel [NHANES]    Frequency of Communication with  Friends and Family: More than three times a week    Frequency of Social Gatherings with Friends and Family: Once a week    Attends Religious Services: Never    Marine scientist or Organizations: Yes    Attends Archivist Meetings: Never    Marital Status: Widowed  Intimate Partner Violence: Not At Risk (03/19/2021)   Humiliation, Afraid, Rape, and Kick questionnaire    Fear of Current or Ex-Partner: No    Emotionally Abused: No    Physically Abused: No    Sexually Abused: No    Outpatient Medications Prior to Visit  Medication Sig Dispense Refill   ACCU-CHEK GUIDE test strip USE AS DIRECTED TO TEST TWICE DAILY. 100 strip 0   aspirin 81 MG tablet Take 81 mg by mouth daily.     cyanocobalamin 1000 MCG tablet Take 100 mcg by mouth daily.     gabapentin (NEURONTIN) 300 MG capsule TAKE 1 CAPSULE BY MOUTH AT  BEDTIME 90 capsule 3   Magnesium 250 MG TABS Take 1 tablet by mouth daily.     metFORMIN (GLUCOPHAGE) 1000 MG tablet Take 1 tablet (1,000 mg total) by mouth 2 (two) times daily. 180 tablet 3   Multiple Vitamins-Minerals (CENTRUM SILVER 50+MEN PO) Take 1 tablet by mouth daily.     Omega-3 Fatty Acids (FISH OIL) 1000 MG CPDR Take 1,000 mg by mouth.     Vitamin D-Vitamin K (D3 + K2 DOTS) 1000-90 UNIT-MCG TABS Take by mouth.     vitamin E 1000 UNIT capsule Take 1,000 Units by mouth daily.     Fluticasone-Umeclidin-Vilant (TRELEGY ELLIPTA) 100-62.5-25 MCG/INH AEPB Use I inh PO daily 1 each 3   JARDIANCE 10 MG TABS tablet TAKE 1 TABLET BY MOUTH DAILY BEFORE BREAKFAST. 30 tablet 3   tamsulosin (FLOMAX) 0.4 MG CAPS capsule Take 0.4 mg by mouth.     No facility-administered medications prior to visit.    No Active Allergies  ROS Review of Systems  Constitutional:  Positive for fatigue. Negative for chills and fever.  HENT:  Positive for hearing loss. Negative for congestion and sore throat.   Eyes:  Negative for pain and discharge.  Respiratory:  Negative for cough and  shortness of breath.   Cardiovascular:  Negative for chest pain and palpitations.  Gastrointestinal:  Negative for diarrhea, nausea and vomiting.  Endocrine: Negative for polydipsia and polyuria.  Genitourinary:  Negative for dysuria and hematuria.  Musculoskeletal:  Negative for neck pain and neck stiffness.  Skin:  Negative for rash.  Neurological:  Positive  for numbness (B/l feet). Negative for dizziness and headaches.  Psychiatric/Behavioral:  Positive for sleep disturbance. Negative for agitation, behavioral problems, self-injury and suicidal ideas.       Objective:    Physical Exam Vitals reviewed.  Constitutional:      General: He is not in acute distress.    Appearance: He is not diaphoretic.  HENT:     Head: Normocephalic and atraumatic.     Nose: Nose normal.     Mouth/Throat:     Mouth: Mucous membranes are moist.  Eyes:     General: No scleral icterus.    Extraocular Movements: Extraocular movements intact.  Cardiovascular:     Rate and Rhythm: Normal rate and regular rhythm.     Pulses: Normal pulses.     Heart sounds: Normal heart sounds. No murmur heard. Pulmonary:     Breath sounds: Normal breath sounds. No wheezing or rales.  Musculoskeletal:     Cervical back: Neck supple. No tenderness.     Right lower leg: No edema.     Left lower leg: No edema.  Skin:    General: Skin is warm.     Findings: No rash.  Neurological:     General: No focal deficit present.     Mental Status: He is alert and oriented to person, place, and time.     Cranial Nerves: No cranial nerve deficit.     Sensory: No sensory deficit.     Motor: No weakness.  Psychiatric:        Mood and Affect: Mood normal.        Behavior: Behavior normal.     BP 122/72 (BP Location: Right Arm, Patient Position: Sitting, Cuff Size: Normal)   Pulse 74   Resp 16   Ht 5' 10"  (1.778 m)   Wt 190 lb (86.2 kg)   SpO2 93%   BMI 27.26 kg/m  Wt Readings from Last 3 Encounters:  09/17/21 190 lb  (86.2 kg)  08/06/21 184 lb 15.5 oz (83.9 kg)  07/18/21 185 lb (83.9 kg)    Lab Results  Component Value Date   TSH 3.240 09/10/2021   Lab Results  Component Value Date   WBC 8.7 09/10/2021   HGB 16.5 09/10/2021   HCT 49.0 09/10/2021   MCV 86 09/10/2021   PLT 231 09/10/2021   Lab Results  Component Value Date   NA 142 09/10/2021   K 4.6 09/10/2021   CO2 22 09/10/2021   GLUCOSE 201 (H) 09/10/2021   BUN 23 09/10/2021   CREATININE 1.16 09/10/2021   BILITOT 0.6 09/10/2021   ALKPHOS 83 09/10/2021   AST 20 09/10/2021   ALT 27 09/10/2021   PROT 6.8 09/10/2021   ALBUMIN 4.7 09/10/2021   CALCIUM 10.1 09/10/2021   ANIONGAP 8 08/13/2016   EGFR 61 09/10/2021   Lab Results  Component Value Date   CHOL 161 11/13/2020   Lab Results  Component Value Date   HDL 38 (L) 11/13/2020   Lab Results  Component Value Date   LDLCALC 88 11/13/2020   Lab Results  Component Value Date   TRIG 209 (H) 11/13/2020   Lab Results  Component Value Date   CHOLHDL 4.2 11/13/2020   Lab Results  Component Value Date   HGBA1C 7.3 (H) 09/10/2021      Assessment & Plan:   Problem List Items Addressed This Visit       Cardiovascular and Mediastinum   Essential hypertension    BP Readings  from Last 1 Encounters:  09/17/21 122/72  Well-controlled currently, not on any medications currently Advised DASH diet and moderate exercise/walking as tolerated for now      Relevant Orders   TSH     Respiratory   Chronic obstructive pulmonary disease ([place])    Well-controlled On Trelegy, uses it PRN, advised to use it QD Does not want to have rescue inhaler despite explaining its role      Relevant Medications   Fluticasone-Umeclidin-Vilant (TRELEGY ELLIPTA) 100-62.5-25 MCG/ACT AEPB   Other Relevant Orders   CBC with Differential/Platelet     Endocrine   Type 2 diabetes mellitus with diabetic neuropathy, without long-term current use of insulin ([place]) - Primary    Lab Results   Component Value Date   HGBA1C 7.3 (H) 09/10/2021  Well-controlled now On Metformin 1000 mg BID and Jardiance 10 mg QD Advised to follow diabetic diet Not on statin, does not want to take additional medications F/u CMP and lipid panel Diabetic eye exam: Advised to follow up with Ophthalmology for diabetic eye exam      Relevant Medications   empagliflozin (JARDIANCE) 10 MG TABS tablet   Other Relevant Orders   Hemoglobin A1c   CMP14+EGFR   Diabetic polyneuropathy associated with type 2 diabetes mellitus (HCC)    On Gabapentin Symptoms controlled currently      Relevant Medications   empagliflozin (JARDIANCE) 10 MG TABS tablet   Other Relevant Orders   CBC with Differential/Platelet     Other   Benign prostatic hyperplasia with nocturia    On Flomax, refilled      Relevant Medications   tamsulosin (FLOMAX) 0.4 MG CAPS capsule   Other Relevant Orders   PSA   Other Visit Diagnoses     Mixed hyperlipidemia       Relevant Orders   Lipid panel   Vitamin D deficiency       Relevant Orders   VITAMIN D 25 Hydroxy (Vit-D Deficiency, Fractures)       Meds ordered this encounter  Medications   tamsulosin (FLOMAX) 0.4 MG CAPS capsule    Sig: Take 1 capsule (0.4 mg total) by mouth daily.    Dispense:  90 capsule    Refill:  3   Fluticasone-Umeclidin-Vilant (TRELEGY ELLIPTA) 100-62.5-25 MCG/ACT AEPB    Sig: Inhale 1 puff into the lungs daily.    Dispense:  1 each    Refill:  11   empagliflozin (JARDIANCE) 10 MG TABS tablet    Sig: Take 1 tablet (10 mg total) by mouth daily before breakfast.    Dispense:  90 tablet    Refill:  3    Follow-up: Return in about 6 months (around 03/20/2022) for Annual physical (after 02/06).    Lindell Spar, MD

## 2021-09-18 NOTE — Assessment & Plan Note (Signed)
On Gabapentin Symptoms controlled currently

## 2021-09-19 ENCOUNTER — Telehealth: Payer: Self-pay | Admitting: Internal Medicine

## 2021-09-19 NOTE — Telephone Encounter (Signed)
Patient requesting call back. °

## 2021-09-19 NOTE — Telephone Encounter (Signed)
Patient said he is taking jardiance he was paying 47$ a month but has now reached [facility] [place] it is 147$ wants to know if there is a generic medication that he can take

## 2021-09-21 NOTE — Telephone Encounter (Signed)
Spoke with patient assitance form mailed to patient home will bring back when completed

## 2021-11-08 DIAGNOSIS — E119 Type 2 diabetes mellitus without complications: Secondary | ICD-10-CM | POA: Diagnosis not present

## 2021-11-23 ENCOUNTER — Other Ambulatory Visit: Payer: Self-pay | Admitting: Internal Medicine

## 2021-11-28 ENCOUNTER — Other Ambulatory Visit: Payer: Self-pay | Admitting: Internal Medicine

## 2022-02-06 ENCOUNTER — Other Ambulatory Visit: Payer: Self-pay | Admitting: Internal Medicine

## 2022-03-19 DIAGNOSIS — E1140 Type 2 diabetes mellitus with diabetic neuropathy, unspecified: Secondary | ICD-10-CM | POA: Diagnosis not present

## 2022-03-19 DIAGNOSIS — E782 Mixed hyperlipidemia: Secondary | ICD-10-CM | POA: Diagnosis not present

## 2022-03-19 DIAGNOSIS — E559 Vitamin D deficiency, unspecified: Secondary | ICD-10-CM | POA: Diagnosis not present

## 2022-03-19 DIAGNOSIS — I10 Essential (primary) hypertension: Secondary | ICD-10-CM | POA: Diagnosis not present

## 2022-03-19 DIAGNOSIS — E1142 Type 2 diabetes mellitus with diabetic polyneuropathy: Secondary | ICD-10-CM | POA: Diagnosis not present

## 2022-03-20 ENCOUNTER — Encounter: Payer: Medicare Other | Admitting: Family Medicine

## 2022-03-20 LAB — VITAMIN D 25 HYDROXY (VIT D DEFICIENCY, FRACTURES): Vit D, 25-Hydroxy: 29.5 ng/mL — ABNORMAL LOW (ref 30.0–100.0)

## 2022-03-20 LAB — LIPID PANEL
Chol/HDL Ratio: 4.4 ratio (ref 0.0–5.0)
Cholesterol, Total: 169 mg/dL (ref 100–199)
HDL: 38 mg/dL — ABNORMAL LOW (ref >39)
LDL Chol Calc (NIH): 103 mg/dL — ABNORMAL HIGH (ref 0–99)
Triglycerides: 160 mg/dL — ABNORMAL HIGH (ref 0–149)
VLDL Cholesterol Cal: 28 mg/dL (ref 5–40)

## 2022-03-20 LAB — CBC WITH DIFFERENTIAL/PLATELET
Basophils Absolute: 0 10*3/uL (ref 0.0–0.2)
Basos: 0 %
EOS (ABSOLUTE): 0.2 10*3/uL (ref 0.0–0.4)
Eos: 2 %
Hematocrit: 47.6 % (ref 37.5–51.0)
Hemoglobin: 15.7 g/dL (ref 13.0–17.7)
Immature Grans (Abs): 0 10*3/uL (ref 0.0–0.1)
Immature Granulocytes: 0 %
Lymphocytes Absolute: 2.1 10*3/uL (ref 0.7–3.1)
Lymphs: 22 %
MCH: 29 pg (ref 26.6–33.0)
MCHC: 33 g/dL (ref 31.5–35.7)
MCV: 88 fL (ref 79–97)
Monocytes Absolute: 0.7 10*3/uL (ref 0.1–0.9)
Monocytes: 7 %
Neutrophils Absolute: 6.5 10*3/uL (ref 1.4–7.0)
Neutrophils: 69 %
Platelets: 250 10*3/uL (ref 150–450)
RBC: 5.41 x10E6/uL (ref 4.14–5.80)
RDW: 12.8 % (ref 11.6–15.4)
WBC: 9.6 10*3/uL (ref 3.4–10.8)

## 2022-03-20 LAB — CMP14+EGFR
ALT: 18 IU/L (ref 0–44)
AST: 17 IU/L (ref 0–40)
Albumin/Globulin Ratio: 2.1 (ref 1.2–2.2)
Albumin: 4.6 g/dL (ref 3.7–4.7)
Alkaline Phosphatase: 89 IU/L (ref 44–121)
BUN/Creatinine Ratio: 14 (ref 10–24)
BUN: 14 mg/dL (ref 8–27)
Bilirubin Total: 1.1 mg/dL (ref 0.0–1.2)
CO2: 22 mmol/L (ref 20–29)
Calcium: 10 mg/dL (ref 8.6–10.2)
Chloride: 100 mmol/L (ref 96–106)
Creatinine, Ser: 1.02 mg/dL (ref 0.76–1.27)
Globulin, Total: 2.2 g/dL (ref 1.5–4.5)
Glucose: 163 mg/dL — ABNORMAL HIGH (ref 70–99)
Potassium: 4.5 mmol/L (ref 3.5–5.2)
Sodium: 141 mmol/L (ref 134–144)
Total Protein: 6.8 g/dL (ref 6.0–8.5)
eGFR: 71 mL/min/{1.73_m2} (ref >59)

## 2022-03-20 LAB — TSH: TSH: 5.1 u[IU]/mL — ABNORMAL HIGH (ref 0.450–4.500)

## 2022-03-20 LAB — HEMOGLOBIN A1C
Est. average glucose Bld gHb Est-mCnc: 163 mg/dL
Hgb A1c MFr Bld: 7.3 % — ABNORMAL HIGH (ref 4.8–5.6)

## 2022-03-20 LAB — PSA: Prostate Specific Ag, Serum: 5.1 ng/mL — ABNORMAL HIGH (ref 0.0–4.0)

## 2022-03-25 ENCOUNTER — Encounter: Payer: Self-pay | Admitting: Internal Medicine

## 2022-03-25 ENCOUNTER — Ambulatory Visit (INDEPENDENT_AMBULATORY_CARE_PROVIDER_SITE_OTHER): Payer: Medicare Other | Admitting: Internal Medicine

## 2022-03-25 VITALS — BP 122/64 | HR 89 | Ht 70.0 in | Wt 188.8 lb

## 2022-03-25 DIAGNOSIS — Z2821 Immunization not carried out because of patient refusal: Secondary | ICD-10-CM | POA: Diagnosis not present

## 2022-03-25 DIAGNOSIS — E038 Other specified hypothyroidism: Secondary | ICD-10-CM | POA: Insufficient documentation

## 2022-03-25 DIAGNOSIS — N401 Enlarged prostate with lower urinary tract symptoms: Secondary | ICD-10-CM

## 2022-03-25 DIAGNOSIS — H906 Mixed conductive and sensorineural hearing loss, bilateral: Secondary | ICD-10-CM | POA: Diagnosis not present

## 2022-03-25 DIAGNOSIS — Z0001 Encounter for general adult medical examination with abnormal findings: Secondary | ICD-10-CM

## 2022-03-25 DIAGNOSIS — R351 Nocturia: Secondary | ICD-10-CM

## 2022-03-25 DIAGNOSIS — I10 Essential (primary) hypertension: Secondary | ICD-10-CM

## 2022-03-25 DIAGNOSIS — J439 Emphysema, unspecified: Secondary | ICD-10-CM | POA: Diagnosis not present

## 2022-03-25 DIAGNOSIS — E1140 Type 2 diabetes mellitus with diabetic neuropathy, unspecified: Secondary | ICD-10-CM | POA: Diagnosis not present

## 2022-03-25 DIAGNOSIS — R972 Elevated prostate specific antigen [PSA]: Secondary | ICD-10-CM

## 2022-03-25 NOTE — Assessment & Plan Note (Signed)
Lab Results  Component Value Date   HGBA1C 7.3 (H) 03/19/2022   Well-controlled now On Metformin 1000 mg BID and Jardiance 10 mg QD Advised to follow diabetic diet Not on statin, does not want to take additional medications F/u CMP and lipid panel Diabetic eye exam: Advised to follow up with Ophthalmology for diabetic eye exam

## 2022-03-25 NOTE — Assessment & Plan Note (Signed)
On Flomax, refilled

## 2022-03-25 NOTE — Assessment & Plan Note (Signed)
Has BPH Recheck PSA after 4 months

## 2022-03-25 NOTE — Assessment & Plan Note (Addendum)
Physical exam as documented. Fasting blood tests reviewed today. Refuses flu and PCV20 vaccines.

## 2022-03-25 NOTE — Assessment & Plan Note (Signed)
Needs hearing test and hearing aide

## 2022-03-25 NOTE — Assessment & Plan Note (Signed)
Well-controlled On Trelegy, uses it PRN, advised to use it QD Does not want to have rescue inhaler despite explaining its role

## 2022-03-25 NOTE — Patient Instructions (Signed)
Please ask pharmacy about Jardiance.  Please continue taking other medications as prescribed.  Please get fasting blood tests done before the next visit.

## 2022-03-25 NOTE — Assessment & Plan Note (Addendum)
BP Readings from Last 1 Encounters:  03/25/22 122/64   Well-controlled currently, not on any medications currently Advised DASH diet and moderate exercise/walking as tolerated for now

## 2022-03-25 NOTE — Progress Notes (Addendum)
Established Patient Office Visit  Subjective:  Patient ID: Jeremy Leon, male    DOB: [birth year]  Age: 89 y.o. MRN: [id number]  CC:  Chief Complaint  Patient presents with   Annual Exam    HPI Jeremy Leon is a 89 y.o. male with past medical history of type II DM with neuropathy, HTN and COPD who presents for annual physical.  Type II DM: His HbA1C has improved to 7.3 now.  He is taking metformin and Jardiance now.  His blood glucose ranges between 120-200 most of the time, depending on what he eats.  He admits that he needs to improve on his diet.  He denies any polyuria or polydipsia.  He also takes gabapentin for peripheral neuropathy.  HTN: BP is well-controlled. Takes medications regularly. Patient denies headache, dizziness, chest pain, dyspnea or palpitations.  He denies flu and pneumococcal vaccines today.  Past Medical History:  Diagnosis Date   Borderline diabetes    COPD (chronic obstructive pulmonary disease) (HCC)    Decreased hearing of both ears    Diabetes mellitus without complication ([place])    History of cardiac catheterization    No significant CAD 2007    Past Surgical History:  Procedure Laterality Date   CARDIAC CATHETERIZATION     CATARACT EXTRACTION W/PHACO Right 07/23/2021   Procedure: CATARACT EXTRACTION PHACO AND INTRAOCULAR LENS PLACEMENT ([place]);  Surgeon: Baruch Goldmann, MD;  Location: [facility];  Service: Ophthalmology;  Laterality: Right;  CDE 16.63   CATARACT EXTRACTION W/PHACO Left 08/06/2021   Procedure: CATARACT EXTRACTION PHACO AND INTRAOCULAR LENS PLACEMENT ([facility]);  Surgeon: Baruch Goldmann, MD;  Location: [facility];  Service: Ophthalmology;  Laterality: Left;  CDE 16.65   TONSILLECTOMY     VASECTOMY      Family History  Problem Relation Age of Onset   Stroke Mother    Colon cancer Father        Died age 74   Leukemia Brother        Died age 87   Diabetes Mellitus II Brother     Social History   Socioeconomic History   Marital  status: Married    Spouse name: Not on file   Number of children: 2   Years of education: 12   Highest education level: Associate degree: occupational, Hotel manager, or vocational program  Occupational History   Occupation: retired  Tobacco Use   Smoking status: Former    Packs/day: 1.00    Years: 30.00    Total pack years: 30.00    Types: Cigarettes    Quit date: 02/11/1997    Years since quitting: 25.1   Smokeless tobacco: Never  Vaping Use   Vaping Use: Never used  Substance and Sexual Activity   Alcohol use: No    Alcohol/week: 0.0 standard drinks of alcohol   Drug use: No   Sexual activity: Yes  Other Topics Concern   Not on file  Social History Narrative         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   Social Determinants of Health   Financial Resource Strain: Low Risk  (03/19/2021)   Overall Financial Resource Strain (CARDIA)    Difficulty of Paying Living Expenses: Not hard at all  Food  Insecurity: No Food Insecurity (03/19/2021)   Hunger Vital Sign    Worried About Running Out of Food in the Last Year: Never true    Ran Out of Food in the Last Year: Never true  Transportation Needs: No Transportation Needs (03/19/2021)   PRAPARE - Hydrologist (Medical): No    Lack of Transportation (Non-Medical): No  Physical Activity: Inactive (03/19/2021)   Exercise Vital Sign    Days of Exercise per Week: 0 days    Minutes of Exercise per Session: 0 min  Stress: Stress Concern Present (03/19/2021)   [place]    Feeling of Stress : To some extent  Social Connections: Moderately Isolated (03/19/2021)   Social Connection and Isolation Panel [NHANES]    Frequency of Communication with Friends and Family: More than three times a week     Frequency of Social Gatherings with Friends and Family: Once a week    Attends Religious Services: Never    Marine scientist or Organizations: Yes    Attends Archivist Meetings: Never    Marital Status: Widowed  Intimate Partner Violence: Not At Risk (03/19/2021)   Humiliation, Afraid, Rape, and Kick questionnaire    Fear of Current or Ex-Partner: No    Emotionally Abused: No    Physically Abused: No    Sexually Abused: No    Outpatient Medications Prior to Visit  Medication Sig Dispense Refill   ACCU-CHEK GUIDE test strip USE AS DIRECTED TO TEST TWICE DAILY. 100 strip 0   aspirin 81 MG tablet Take 81 mg by mouth daily.     cyanocobalamin 1000 MCG tablet Take 100 mcg by mouth daily.     empagliflozin (JARDIANCE) 10 MG TABS tablet Take 1 tablet (10 mg total) by mouth daily before breakfast. 90 tablet 3   Fluticasone-Umeclidin-Vilant (TRELEGY ELLIPTA) 100-62.5-25 MCG/ACT AEPB Inhale 1 puff into the lungs daily. 1 each 11   gabapentin (NEURONTIN) 300 MG capsule TAKE 1 CAPSULE BY MOUTH AT  BEDTIME 90 capsule 3   Magnesium 250 MG TABS Take 1 tablet by mouth daily.     metFORMIN (GLUCOPHAGE) 1000 MG tablet TAKE 1 TABLET BY MOUTH TWICE  DAILY 200 tablet 2   Multiple Vitamins-Minerals (CENTRUM SILVER 50+MEN PO) Take 1 tablet by mouth daily.     Omega-3 Fatty Acids (FISH OIL) 1000 MG CPDR Take 1,000 mg by mouth.     tamsulosin (FLOMAX) 0.4 MG CAPS capsule Take 1 capsule (0.4 mg total) by mouth daily. 90 capsule 3   Vitamin D-Vitamin K (D3 + K2 DOTS) 1000-90 UNIT-MCG TABS Take by mouth.     vitamin E 1000 UNIT capsule Take 1,000 Units by mouth daily.     No facility-administered medications prior to visit.    No Active Allergies   ROS Review of Systems  Constitutional:  Positive for fatigue. Negative for chills and fever.  HENT:  Positive for hearing loss. Negative for congestion and sore throat.   Eyes:  Negative for pain and discharge.  Respiratory:  Negative for cough  and shortness of breath.   Cardiovascular:  Negative for chest pain and palpitations.  Gastrointestinal:  Negative for diarrhea, nausea and vomiting.  Endocrine: Negative for polydipsia and polyuria.  Genitourinary:  Negative for dysuria and hematuria.  Musculoskeletal:  Negative for neck pain and neck stiffness.  Skin:  Negative for rash.  Neurological:  Positive for numbness (B/l feet). Negative for dizziness  and headaches.  Psychiatric/Behavioral:  Positive for dysphoric mood and sleep disturbance. Negative for agitation, behavioral problems, self-injury and suicidal ideas.       Objective:    Physical Exam Vitals reviewed.  Constitutional:      General: He is not in acute distress.    Appearance: He is not diaphoretic.  HENT:     Head: Normocephalic and atraumatic.     Nose: Nose normal.     Mouth/Throat:     Mouth: Mucous membranes are moist.  Eyes:     General: No scleral icterus.    Extraocular Movements: Extraocular movements intact.  Cardiovascular:     Rate and Rhythm: Normal rate and regular rhythm.     Pulses: Normal pulses.     Heart sounds: Normal heart sounds. No murmur heard. Pulmonary:     Breath sounds: Normal breath sounds. No wheezing or rales.  Abdominal:     Palpations: Abdomen is soft.     Tenderness: There is no abdominal tenderness.  Musculoskeletal:     Cervical back: Neck supple. No tenderness.     Right lower leg: No edema.     Left lower leg: No edema.  Skin:    General: Skin is warm.     Findings: No rash.  Neurological:     General: No focal deficit present.     Mental Status: He is alert and oriented to person, place, and time.     Cranial Nerves: No cranial nerve deficit.     Sensory: No sensory deficit.     Motor: No weakness.  Psychiatric:        Mood and Affect: Mood normal.        Behavior: Behavior normal.     BP 122/64 (BP Location: Left Arm, Cuff Size: Normal)   Pulse 89   Ht 5' 10"$  (1.778 m)   Wt 188 lb 12.8 oz (85.6  kg)   SpO2 92%   BMI 27.09 kg/m  Wt Readings from Last 3 Encounters:  03/25/22 188 lb 12.8 oz (85.6 kg)  09/17/21 190 lb (86.2 kg)  08/06/21 184 lb 15.5 oz (83.9 kg)    Lab Results  Component Value Date   TSH 5.100 (H) 03/19/2022   Lab Results  Component Value Date   WBC 9.6 03/19/2022   HGB 15.7 03/19/2022   HCT 47.6 03/19/2022   MCV 88 03/19/2022   PLT 250 03/19/2022   Lab Results  Component Value Date   NA 141 03/19/2022   K 4.5 03/19/2022   CO2 22 03/19/2022   GLUCOSE 163 (H) 03/19/2022   BUN 14 03/19/2022   CREATININE 1.02 03/19/2022   BILITOT 1.1 03/19/2022   ALKPHOS 89 03/19/2022   AST 17 03/19/2022   ALT 18 03/19/2022   PROT 6.8 03/19/2022   ALBUMIN 4.6 03/19/2022   CALCIUM 10.0 03/19/2022   ANIONGAP 8 08/13/2016   EGFR 71 03/19/2022   Lab Results  Component Value Date   CHOL 169 03/19/2022   Lab Results  Component Value Date   HDL 38 (L) 03/19/2022   Lab Results  Component Value Date   LDLCALC 103 (H) 03/19/2022   Lab Results  Component Value Date   TRIG 160 (H) 03/19/2022   Lab Results  Component Value Date   CHOLHDL 4.4 03/19/2022   Lab Results  Component Value Date   HGBA1C 7.3 (H) 03/19/2022      Assessment & Plan:   Problem List Items Addressed This Visit  Encounter for general adult medical examination with abnormal findings - Primary   Physical exam as documented. Counseling done  re healthy lifestyle involving commitment to 150 minutes exercise per week, heart healthy diet, and attaining healthy weight.The importance of adequate sleep also discussed. Changes in health habits are decided on by the patient with goals and time frames  set for achieving them. Immunization and cancer screening needs are specifically addressed at this visit.     Relevant Orders  TSH  CMP14+EGFR  CBC with Differential/Platelet    Respiratory   Chronic obstructive pulmonary disease (HCC)    Well-controlled On Trelegy         Endocrine   Type 2 diabetes mellitus with diabetic neuropathy, without long-term current use of insulin (HCC)    Lab Results  Component Value Date   HGBA1C 7.2 (A) 03/19/2021  Well-controlled now On Metformin 1000 mg BID and Jardiance 10 mg QD Advised to follow diabetic diet Not on statin, does not want to take additional medications F/u CMP and lipid panel Diabetic eye exam: Advised to follow up with Ophthalmology for diabetic eye exam      Relevant Orders   POCT glycosylated hemoglobin (Hb A1C) (Completed)   Hemoglobin A1c   CMP14+EGFR   Diabetic polyneuropathy associated with type 2 diabetes mellitus ([place])    On Gabapentin Symptoms controlled currently      Other Visit Diagnoses     Influenza vaccine refused       Refused pneumococcal vaccine       Vitamin D deficiency       Relevant Orders   VITAMIN D 25 Hydroxy (Vit-D Deficiency, Fractures)       No orders of the defined types were placed in this encounter.   Follow-up: Return in about 4 months (around 07/24/2022) for DM and PSA, TSH check.    Lindell Spar, MD

## 2022-03-25 NOTE — Assessment & Plan Note (Signed)
Asymptomatic Recheck TSH and free T4

## 2022-05-07 DIAGNOSIS — E119 Type 2 diabetes mellitus without complications: Secondary | ICD-10-CM | POA: Diagnosis not present

## 2022-05-07 LAB — HM DIABETES EYE EXAM

## 2022-05-14 ENCOUNTER — Other Ambulatory Visit: Payer: Self-pay | Admitting: Internal Medicine

## 2022-05-31 ENCOUNTER — Other Ambulatory Visit: Payer: Self-pay | Admitting: Internal Medicine

## 2022-05-31 DIAGNOSIS — E1142 Type 2 diabetes mellitus with diabetic polyneuropathy: Secondary | ICD-10-CM

## 2022-07-04 ENCOUNTER — Telehealth: Payer: Self-pay | Admitting: *Deleted

## 2022-07-04 NOTE — Progress Notes (Signed)
  Care Coordination  Outreach Note  07/04/2022 Name: Jeremy Leon MRN: [id number] DOB: 1932/12/23   Care Coordination Outreach Attempts: An unsuccessful telephone outreach was attempted today to offer the patient information about available care coordination services.  Follow Up Plan:  Additional outreach attempts will be made to offer the patient care coordination information and services.   Encounter Outcome:  No Answer  Christie Nottingham  Care Coordination Care Guide  Direct Dial: [phone]

## 2022-07-10 NOTE — Progress Notes (Unsigned)
  Care Coordination  Outreach Note  07/10/2022 Name: DAMAIN VASA MRN: [id number] DOB: May 11, 1932   Care Coordination Outreach Attempts: A second unsuccessful outreach was attempted today to offer the patient with information about available care coordination services.  Follow Up Plan:  Additional outreach attempts will be made to offer the patient care coordination information and services.   Encounter Outcome:  No Answer  Christie Nottingham  Care Coordination Care Guide  Direct Dial: [phone]

## 2022-07-11 NOTE — Progress Notes (Signed)
  Care Coordination  Outreach Note  07/11/2022 Name: Jeremy Leon MRN: [id number] DOB: 12/15/1932   Care Coordination Outreach Attempts: A third unsuccessful outreach was attempted today to offer the patient with information about available care coordination services.  Follow Up Plan:  No further outreach attempts will be made at this time. We have been unable to contact the patient to offer or enroll patient in care coordination services  Encounter Outcome:  No Answer  Christie Nottingham  Care Coordination Care Guide  Direct Dial: [phone]

## 2022-07-15 DIAGNOSIS — E038 Other specified hypothyroidism: Secondary | ICD-10-CM | POA: Diagnosis not present

## 2022-07-15 DIAGNOSIS — E1140 Type 2 diabetes mellitus with diabetic neuropathy, unspecified: Secondary | ICD-10-CM | POA: Diagnosis not present

## 2022-07-16 LAB — TSH+FREE T4
Free T4: 1.15 ng/dL (ref 0.82–1.77)
TSH: 2.79 u[IU]/mL (ref 0.450–4.500)

## 2022-07-16 LAB — PSA: Prostate Specific Ag, Serum: 6.9 ng/mL — ABNORMAL HIGH (ref 0.0–4.0)

## 2022-07-16 LAB — HEMOGLOBIN A1C
Est. average glucose Bld gHb Est-mCnc: 151 mg/dL
Hgb A1c MFr Bld: 6.9 % — ABNORMAL HIGH (ref 4.8–5.6)

## 2022-07-24 ENCOUNTER — Ambulatory Visit (INDEPENDENT_AMBULATORY_CARE_PROVIDER_SITE_OTHER): Payer: Medicare Other | Admitting: Internal Medicine

## 2022-07-24 ENCOUNTER — Encounter: Payer: Self-pay | Admitting: Internal Medicine

## 2022-07-24 VITALS — BP 137/72 | HR 77 | Wt 188.4 lb

## 2022-07-24 DIAGNOSIS — Z7984 Long term (current) use of oral hypoglycemic drugs: Secondary | ICD-10-CM

## 2022-07-24 DIAGNOSIS — J439 Emphysema, unspecified: Secondary | ICD-10-CM

## 2022-07-24 DIAGNOSIS — I10 Essential (primary) hypertension: Secondary | ICD-10-CM | POA: Diagnosis not present

## 2022-07-24 DIAGNOSIS — E038 Other specified hypothyroidism: Secondary | ICD-10-CM | POA: Diagnosis not present

## 2022-07-24 DIAGNOSIS — E1142 Type 2 diabetes mellitus with diabetic polyneuropathy: Secondary | ICD-10-CM | POA: Diagnosis not present

## 2022-07-24 DIAGNOSIS — R972 Elevated prostate specific antigen [PSA]: Secondary | ICD-10-CM

## 2022-07-24 DIAGNOSIS — E1140 Type 2 diabetes mellitus with diabetic neuropathy, unspecified: Secondary | ICD-10-CM

## 2022-07-24 NOTE — Assessment & Plan Note (Signed)
Well-controlled On Trelegy, uses it PRN, advised to use it QD Does not want to have rescue inhaler despite explaining its role 

## 2022-07-24 NOTE — Assessment & Plan Note (Signed)
BP Readings from Last 1 Encounters:  07/24/22 137/72   Well-controlled currently, not on any medications currently Advised DASH diet and moderate exercise/walking as tolerated for now

## 2022-07-24 NOTE — Assessment & Plan Note (Addendum)
Has BPH On Flomax, does not take it regularly Advised urology referral, but he prefers to wait for now Recheck PSA after 4 months

## 2022-07-24 NOTE — Assessment & Plan Note (Signed)
On Gabapentin °Symptoms controlled currently °

## 2022-07-24 NOTE — Progress Notes (Signed)
Established Patient Office Visit  Subjective:  Patient ID: Jeremy Leon, male    DOB: 1932-06-04  Age: 89 y.o. MRN: [id number]  CC:  Chief Complaint  Patient presents with   Diabetes    Four month follow up    HPI Jeremy Leon is a 89 y.o. male with past medical history of type II DM with neuropathy, HTN and COPD who presents for f/u of his chronic medical conditions.  Type II DM: His HbA1C has improved to 6.9 now.  He is taking metformin and Jardiance now.  His blood glucose ranges between 120-150 most of the time, depending on what he eats.  He admits that he needs to improve on his diet.  He denies any polyuria or polydipsia.  He also takes gabapentin for peripheral neuropathy.  HTN: BP is well-controlled. Takes medications regularly. Patient denies headache, dizziness, chest pain, dyspnea or palpitations.   He denies flu and pneumococcal vaccines today.  Past Medical History:  Diagnosis Date   Borderline diabetes    COPD (chronic obstructive pulmonary disease) (HCC)    Decreased hearing of both ears    Diabetes mellitus without complication (HCC)    History of cardiac catheterization    No significant CAD 2007    Past Surgical History:  Procedure Laterality Date   CARDIAC CATHETERIZATION     CATARACT EXTRACTION W/PHACO Right 07/23/2021   Procedure: CATARACT EXTRACTION PHACO AND INTRAOCULAR LENS PLACEMENT ([facility]);  Surgeon: Fabio Pierce, MD;  Location: [facility];  Service: Ophthalmology;  Laterality: Right;  CDE 16.63   CATARACT EXTRACTION W/PHACO Left 08/06/2021   Procedure: CATARACT EXTRACTION PHACO AND INTRAOCULAR LENS PLACEMENT ([facility]);  Surgeon: Fabio Pierce, MD;  Location: [facility];  Service: Ophthalmology;  Laterality: Left;  CDE 16.65   TONSILLECTOMY     VASECTOMY      Family History  Problem Relation Age of Onset   Stroke Mother    Colon cancer Father        Died age 71   Leukemia Brother        Died age 41   Diabetes Mellitus II Brother     Social  History   Socioeconomic History   Marital status: Married    Spouse name: Not on file   Number of children: 2   Years of education: 12   Highest education level: Some college, no degree  Occupational History   Occupation: retired  Tobacco Use   Smoking status: Former    Packs/day: 1.00    Years: 30.00    Additional pack years: 0.00    Total pack years: 30.00    Types: Cigarettes    Quit date: 02/11/1997    Years since quitting: 25.4   Smokeless tobacco: Never  Vaping Use   Vaping Use: Never used  Substance and Sexual Activity   Alcohol use: No    Alcohol/week: 0.0 standard drinks of alcohol   Drug use: No   Sexual activity: Yes  Other Topics Concern   Not on file  Social History Narrative         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   Social Determinants of Health   Financial Resource Strain: Low Risk  (07/20/2022)   Overall Physicist, medical Strain (  CARDIA)    Difficulty of Paying Living Expenses: Not hard at all  Food Insecurity: No Food Insecurity (07/20/2022)   Hunger Vital Sign    Worried About Running Out of Food in the Last Year: Never true    Ran Out of Food in the Last Year: Never true  Transportation Needs: No Transportation Needs (07/20/2022)   PRAPARE - Administrator, Civil Service (Medical): No    Lack of Transportation (Non-Medical): No  Physical Activity: Unknown (07/20/2022)   Exercise Vital Sign    Days of Exercise per Week: 0 days    Minutes of Exercise per Session: Not on file  Stress: No Stress Concern Present (07/20/2022)   [employer] of Occupational Health - Occupational Stress Questionnaire    Feeling of Stress : Only a little  Social Connections: Moderately Isolated (07/20/2022)   Social Connection and Isolation Panel [NHANES]    Frequency of Communication with Friends and  Family: Twice a week    Frequency of Social Gatherings with Friends and Family: Once a week    Attends Religious Services: Never    Database administrator or Organizations: Yes    Attends Banker Meetings: Never    Marital Status: Widowed  Intimate Partner Violence: Not At Risk (03/19/2021)   Humiliation, Afraid, Rape, and Kick questionnaire    Fear of Current or Ex-Partner: No    Emotionally Abused: No    Physically Abused: No    Sexually Abused: No    Outpatient Medications Prior to Visit  Medication Sig Dispense Refill   ACCU-CHEK GUIDE test strip USE AS DIRECTED TO TEST TWICE DAILY. 100 strip 0   aspirin 81 MG tablet Take 81 mg by mouth daily.     cyanocobalamin 1000 MCG tablet Take 100 mcg by mouth daily.     empagliflozin (JARDIANCE) 10 MG TABS tablet Take 1 tablet (10 mg total) by mouth daily before breakfast. 90 tablet 3   Fluticasone-Umeclidin-Vilant (TRELEGY ELLIPTA) 100-62.5-25 MCG/ACT AEPB Inhale 1 puff into the lungs daily. 1 each 11   gabapentin (NEURONTIN) 300 MG capsule TAKE 1 CAPSULE BY MOUTH AT  BEDTIME 100 capsule 2   Magnesium 250 MG TABS Take 1 tablet by mouth daily.     metFORMIN (GLUCOPHAGE) 1000 MG tablet TAKE 1 TABLET BY MOUTH TWICE  DAILY 200 tablet 2   Multiple Vitamins-Minerals (CENTRUM SILVER 50+MEN PO) Take 1 tablet by mouth daily.     Omega-3 Fatty Acids (FISH OIL) 1000 MG CPDR Take 1,000 mg by mouth.     tamsulosin (FLOMAX) 0.4 MG CAPS capsule Take 1 capsule (0.4 mg total) by mouth daily. 90 capsule 3   Vitamin D-Vitamin K (D3 + K2 DOTS) 1000-90 UNIT-MCG TABS Take by mouth.     vitamin E 1000 UNIT capsule Take 1,000 Units by mouth daily.     No facility-administered medications prior to visit.    No Active Allergies  ROS Review of Systems  Constitutional:  Positive for fatigue. Negative for chills and fever.  HENT:  Positive for hearing loss. Negative for congestion and sore throat.   Eyes:  Negative for pain and discharge.   Respiratory:  Negative for cough and shortness of breath.   Cardiovascular:  Negative for chest pain and palpitations.  Gastrointestinal:  Negative for diarrhea, nausea and vomiting.  Endocrine: Negative for polydipsia and polyuria.  Genitourinary:  Negative for dysuria and hematuria.  Musculoskeletal:  Negative for neck pain and neck stiffness.  Skin:  Negative  for rash.  Neurological:  Positive for numbness (B/l feet). Negative for dizziness and headaches.  Psychiatric/Behavioral:  Positive for sleep disturbance. Negative for agitation, behavioral problems, self-injury and suicidal ideas.       Objective:    Physical Exam Vitals reviewed.  Constitutional:      General: He is not in acute distress.    Appearance: He is not diaphoretic.  HENT:     Head: Normocephalic and atraumatic.     Nose: Nose normal.     Mouth/Throat:     Mouth: Mucous membranes are moist.  Eyes:     General: No scleral icterus.    Extraocular Movements: Extraocular movements intact.  Cardiovascular:     Rate and Rhythm: Normal rate and regular rhythm.     Pulses: Normal pulses.     Heart sounds: Normal heart sounds. No murmur heard. Pulmonary:     Breath sounds: Normal breath sounds. No wheezing or rales.  Musculoskeletal:     Cervical back: Neck supple. No tenderness.     Right lower leg: No edema.     Left lower leg: No edema.  Skin:    General: Skin is warm.     Findings: No rash.  Neurological:     General: No focal deficit present.     Mental Status: He is alert and oriented to person, place, and time.     Cranial Nerves: No cranial nerve deficit.     Sensory: Sensory deficit (B/l feet) present.     Motor: No weakness.  Psychiatric:        Mood and Affect: Mood normal.        Behavior: Behavior normal.     BP 137/72 (BP Location: Right Arm, Patient Position: Sitting, Cuff Size: Normal)   Pulse 77   Wt 188 lb 6.4 oz (85.5 kg)   SpO2 91%   BMI 27.03 kg/m  Wt Readings from Last 3  Encounters:  07/24/22 188 lb 6.4 oz (85.5 kg)  03/25/22 188 lb 12.8 oz (85.6 kg)  09/17/21 190 lb (86.2 kg)    Lab Results  Component Value Date   TSH 2.790 07/15/2022   Lab Results  Component Value Date   WBC 9.6 03/19/2022   HGB 15.7 03/19/2022   HCT 47.6 03/19/2022   MCV 88 03/19/2022   PLT 250 03/19/2022   Lab Results  Component Value Date   NA 141 03/19/2022   K 4.5 03/19/2022   CO2 22 03/19/2022   GLUCOSE 163 (H) 03/19/2022   BUN 14 03/19/2022   CREATININE 1.02 03/19/2022   BILITOT 1.1 03/19/2022   ALKPHOS 89 03/19/2022   AST 17 03/19/2022   ALT 18 03/19/2022   PROT 6.8 03/19/2022   ALBUMIN 4.6 03/19/2022   CALCIUM 10.0 03/19/2022   ANIONGAP 8 08/13/2016   EGFR 71 03/19/2022   Lab Results  Component Value Date   CHOL 169 03/19/2022   Lab Results  Component Value Date   HDL 38 (L) 03/19/2022   Lab Results  Component Value Date   LDLCALC 103 (H) 03/19/2022   Lab Results  Component Value Date   TRIG 160 (H) 03/19/2022   Lab Results  Component Value Date   CHOLHDL 4.4 03/19/2022   Lab Results  Component Value Date   HGBA1C 6.9 (H) 07/15/2022      Assessment & Plan:   Problem List Items Addressed This Visit       Cardiovascular and Mediastinum   Essential hypertension - Primary  BP Readings from Last 1 Encounters:  07/24/22 137/72  Well-controlled currently, not on any medications currently Advised DASH diet and moderate exercise/walking as tolerated for now        Respiratory   Chronic obstructive pulmonary disease (HCC)    Well-controlled On Trelegy, uses it PRN, advised to use it QD Does not want to have rescue inhaler despite explaining its role        Endocrine   Type 2 diabetes mellitus with diabetic neuropathy, without long-term current use of insulin (HCC)    Lab Results  Component Value Date   HGBA1C 6.9 (H) 07/15/2022  Well-controlled now On Metformin 1000 mg BID and Jardiance 10 mg QD Advised to follow diabetic  diet Not on statin, does not want to take additional medications F/u CMP and lipid panel Diabetic eye exam: Advised to follow up with Ophthalmology for diabetic eye exam      Relevant Orders   CMP14+EGFR   Hemoglobin A1c   Diabetic polyneuropathy associated with type 2 diabetes mellitus (HCC)    On Gabapentin Symptoms controlled currently      Subclinical hypothyroidism    Asymptomatic Rechecked TSH and free T4 - wnl now        Other   Elevated PSA    Has BPH On Flomax, does not take it regularly Advised urology referral, but he prefers to wait for now Recheck PSA after 4 months      Relevant Orders   PSA   No orders of the defined types were placed in this encounter.   Follow-up: Return in about 5 months (around 12/24/2022) for DM and BPH.    Anabel Halon, MD

## 2022-07-24 NOTE — Assessment & Plan Note (Signed)
Asymptomatic Rechecked TSH and free T4 - wnl now

## 2022-07-24 NOTE — Patient Instructions (Signed)
Please continue to take medications as prescribed.  Please continue to follow low carb diet and ambulate as tolerated.  Please get fasting blood tests done before the next visit. 

## 2022-07-24 NOTE — Assessment & Plan Note (Signed)
Lab Results  Component Value Date   HGBA1C 6.9 (H) 07/15/2022   Well-controlled now On Metformin 1000 mg BID and Jardiance 10 mg QD Advised to follow diabetic diet Not on statin, does not want to take additional medications F/u CMP and lipid panel Diabetic eye exam: Advised to follow up with Ophthalmology for diabetic eye exam

## 2022-08-12 ENCOUNTER — Other Ambulatory Visit: Payer: Self-pay | Admitting: Internal Medicine

## 2022-10-07 ENCOUNTER — Other Ambulatory Visit: Payer: Self-pay | Admitting: Internal Medicine

## 2022-12-15 ENCOUNTER — Other Ambulatory Visit: Payer: Self-pay | Admitting: Internal Medicine

## 2022-12-17 DIAGNOSIS — E1140 Type 2 diabetes mellitus with diabetic neuropathy, unspecified: Secondary | ICD-10-CM | POA: Diagnosis not present

## 2022-12-18 LAB — CMP14+EGFR
ALT: 17 [IU]/L (ref 0–44)
AST: 18 [IU]/L (ref 0–40)
Albumin: 4.5 g/dL (ref 3.7–4.7)
Alkaline Phosphatase: 87 [IU]/L (ref 44–121)
BUN/Creatinine Ratio: 16 (ref 10–24)
BUN: 16 mg/dL (ref 8–27)
Bilirubin Total: 0.7 mg/dL (ref 0.0–1.2)
CO2: 21 mmol/L (ref 20–29)
Calcium: 9.4 mg/dL (ref 8.6–10.2)
Chloride: 103 mmol/L (ref 96–106)
Creatinine, Ser: 1.03 mg/dL (ref 0.76–1.27)
Globulin, Total: 2.1 g/dL (ref 1.5–4.5)
Glucose: 194 mg/dL — ABNORMAL HIGH (ref 70–99)
Potassium: 4.3 mmol/L (ref 3.5–5.2)
Sodium: 141 mmol/L (ref 134–144)
Total Protein: 6.6 g/dL (ref 6.0–8.5)
eGFR: 70 mL/min/{1.73_m2} (ref >59)

## 2022-12-18 LAB — PSA: Prostate Specific Ag, Serum: 5.8 ng/mL — ABNORMAL HIGH (ref 0.0–4.0)

## 2022-12-18 LAB — HEMOGLOBIN A1C
Est. average glucose Bld gHb Est-mCnc: 177 mg/dL
Hgb A1c MFr Bld: 7.8 % — ABNORMAL HIGH (ref 4.8–5.6)

## 2022-12-24 ENCOUNTER — Ambulatory Visit (INDEPENDENT_AMBULATORY_CARE_PROVIDER_SITE_OTHER): Payer: Medicare Other | Admitting: Internal Medicine

## 2022-12-24 ENCOUNTER — Encounter: Payer: Self-pay | Admitting: Internal Medicine

## 2022-12-24 VITALS — BP 132/60 | HR 75 | Ht 70.0 in | Wt 193.6 lb

## 2022-12-24 DIAGNOSIS — N401 Enlarged prostate with lower urinary tract symptoms: Secondary | ICD-10-CM

## 2022-12-24 DIAGNOSIS — E782 Mixed hyperlipidemia: Secondary | ICD-10-CM | POA: Diagnosis not present

## 2022-12-24 DIAGNOSIS — E1142 Type 2 diabetes mellitus with diabetic polyneuropathy: Secondary | ICD-10-CM

## 2022-12-24 DIAGNOSIS — I10 Essential (primary) hypertension: Secondary | ICD-10-CM

## 2022-12-24 DIAGNOSIS — J439 Emphysema, unspecified: Secondary | ICD-10-CM | POA: Diagnosis not present

## 2022-12-24 DIAGNOSIS — E559 Vitamin D deficiency, unspecified: Secondary | ICD-10-CM | POA: Diagnosis not present

## 2022-12-24 DIAGNOSIS — R972 Elevated prostate specific antigen [PSA]: Secondary | ICD-10-CM

## 2022-12-24 DIAGNOSIS — Z7984 Long term (current) use of oral hypoglycemic drugs: Secondary | ICD-10-CM | POA: Diagnosis not present

## 2022-12-24 DIAGNOSIS — E1140 Type 2 diabetes mellitus with diabetic neuropathy, unspecified: Secondary | ICD-10-CM

## 2022-12-24 DIAGNOSIS — R351 Nocturia: Secondary | ICD-10-CM

## 2022-12-24 NOTE — Patient Instructions (Addendum)
Continue taking Metformin regularly. Please consider taking Jardiance again.  Please continue to take medications as prescribed.  Please continue to follow low carb diet and ambulate as tolerated.

## 2022-12-24 NOTE — Progress Notes (Signed)
Established Patient Office Visit  Subjective:  Patient ID: Jeremy Leon, male    DOB: October 11, 1932  Age: 89 y.o. MRN: [id number]  CC:  Chief Complaint  Patient presents with   Hypertension    Follow up     HPI Jeremy Leon is a 89 y.o. male with past medical history of type II DM with neuropathy, HTN and COPD who presents for f/u of his chronic medical conditions.  Type II DM: His HbA1C has worsened to 7.8 now.  He is taking metformin 1000 mg twice daily, but has stopped taking Jardiance now.  His blood glucose ranges between 120-150 most of the time, depending on what he eats.  He admits that he needs to improve on his diet.  He denies any polyuria or polydipsia.  He also takes gabapentin for peripheral neuropathy.  HTN: BP is well-controlled. Takes medications regularly. Patient denies headache, dizziness, chest pain, dyspnea or palpitations.   He denies flu and pneumococcal vaccines today.  Past Medical History:  Diagnosis Date   Borderline diabetes    COPD (chronic obstructive pulmonary disease) (HCC)    Decreased hearing of both ears    Diabetes mellitus without complication (HCC)    History of cardiac catheterization    No significant CAD 2007    Past Surgical History:  Procedure Laterality Date   CARDIAC CATHETERIZATION     CATARACT EXTRACTION W/PHACO Right 07/23/2021   Procedure: CATARACT EXTRACTION PHACO AND INTRAOCULAR LENS PLACEMENT (IOC);  Surgeon: Fabio Pierce, MD;  Location: [facility];  Service: Ophthalmology;  Laterality: Right;  CDE 16.63   CATARACT EXTRACTION W/PHACO Left 08/06/2021   Procedure: CATARACT EXTRACTION PHACO AND INTRAOCULAR LENS PLACEMENT (IOC);  Surgeon: Fabio Pierce, MD;  Location: [facility];  Service: Ophthalmology;  Laterality: Left;  CDE 16.65   TONSILLECTOMY     VASECTOMY      Family History  Problem Relation Age of Onset   Stroke Mother    Colon cancer Father        Died age 87   Leukemia Brother        Died age 47   Diabetes  Mellitus II Brother     Social History   Socioeconomic History   Marital status: Widowed    Spouse name: Not on file   Number of children: 2   Years of education: 42   Highest education level: Some college, no degree  Occupational History   Occupation: retired  Tobacco Use   Smoking status: Former    Current packs/day: 0.00    Average packs/day: 1 pack/day for 30.0 years (30.0 ttl pk-yrs)    Types: Cigarettes    Start date: 02/12/1967    Quit date: 02/11/1997    Years since quitting: 25.8   Smokeless tobacco: Never  Vaping Use   Vaping status: Never Used  Substance and Sexual Activity   Alcohol use: No    Alcohol/week: 0.0 standard drinks of alcohol   Drug use: No   Sexual activity: Yes  Other Topics Concern   Not on file  Social History Narrative         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   Social Determinants of Health   Financial Resource Strain: Low Risk  (  07/20/2022)   Overall Financial Resource Strain (CARDIA)    Difficulty of Paying Living Expenses: Not hard at all  Food Insecurity: No Food Insecurity (07/20/2022)   Hunger Vital Sign    Worried About Running Out of Food in the Last Year: Never true    Ran Out of Food in the Last Year: Never true  Transportation Needs: No Transportation Needs (07/20/2022)   PRAPARE - Administrator, Civil Service (Medical): No    Lack of Transportation (Non-Medical): No  Physical Activity: Unknown (07/20/2022)   Exercise Vital Sign    Days of Exercise per Week: 0 days    Minutes of Exercise per Session: Not on file  Stress: No Stress Concern Present (07/20/2022)   [employer] of Occupational Health - Occupational Stress Questionnaire    Feeling of Stress : Only a little  Social Connections: Moderately Isolated (07/20/2022)   Social Connection and Isolation Panel  [NHANES]    Frequency of Communication with Friends and Family: Twice a week    Frequency of Social Gatherings with Friends and Family: Once a week    Attends Religious Services: Never    Database administrator or Organizations: Yes    Attends Banker Meetings: Never    Marital Status: Widowed  Intimate Partner Violence: Not At Risk (03/19/2021)   Humiliation, Afraid, Rape, and Kick questionnaire    Fear of Current or Ex-Partner: No    Emotionally Abused: No    Physically Abused: No    Sexually Abused: No    Outpatient Medications Prior to Visit  Medication Sig Dispense Refill   ACCU-CHEK GUIDE test strip USE AS DIRECTED TO TEST TWICE DAILY. 100 strip 0   aspirin 81 MG tablet Take 81 mg by mouth daily.     cyanocobalamin 1000 MCG tablet Take 100 mcg by mouth daily.     empagliflozin (JARDIANCE) 10 MG TABS tablet Take 1 tablet (10 mg total) by mouth daily before breakfast. 90 tablet 3   Fluticasone-Umeclidin-Vilant (TRELEGY ELLIPTA) 100-62.5-25 MCG/ACT AEPB Inhale 1 puff into the lungs daily. 1 each 11   gabapentin (NEURONTIN) 300 MG capsule TAKE 1 CAPSULE BY MOUTH AT  BEDTIME 100 capsule 2   Magnesium 250 MG TABS Take 1 tablet by mouth daily.     metFORMIN (GLUCOPHAGE) 1000 MG tablet TAKE 1 TABLET BY MOUTH TWICE  DAILY 200 tablet 2   Multiple Vitamins-Minerals (CENTRUM SILVER 50+MEN PO) Take 1 tablet by mouth daily.     Omega-3 Fatty Acids (FISH OIL) 1000 MG CPDR Take 1,000 mg by mouth.     tamsulosin (FLOMAX) 0.4 MG CAPS capsule Take 1 capsule (0.4 mg total) by mouth daily. 90 capsule 3   Vitamin D-Vitamin K (D3 + K2 DOTS) 1000-90 UNIT-MCG TABS Take by mouth.     vitamin E 1000 UNIT capsule Take 1,000 Units by mouth daily.     No facility-administered medications prior to visit.    No Active Allergies  ROS Review of Systems  Constitutional:  Positive for fatigue. Negative for chills and fever.  HENT:  Positive for hearing loss. Negative for congestion and sore  throat.   Eyes:  Negative for pain and discharge.  Respiratory:  Negative for cough and shortness of breath.   Cardiovascular:  Negative for chest pain and palpitations.  Gastrointestinal:  Negative for diarrhea, nausea and vomiting.  Endocrine: Negative for polydipsia and polyuria.  Genitourinary:  Negative for dysuria and hematuria.  Musculoskeletal:  Negative for neck pain  and neck stiffness.  Skin:  Negative for rash.  Neurological:  Positive for numbness (B/l feet). Negative for dizziness and headaches.  Psychiatric/Behavioral:  Positive for sleep disturbance. Negative for agitation, behavioral problems, self-injury and suicidal ideas.       Objective:    Physical Exam Vitals reviewed.  Constitutional:      General: He is not in acute distress.    Appearance: He is not diaphoretic.  HENT:     Head: Normocephalic and atraumatic.     Nose: Nose normal.     Mouth/Throat:     Mouth: Mucous membranes are moist.  Eyes:     General: No scleral icterus.    Extraocular Movements: Extraocular movements intact.  Cardiovascular:     Rate and Rhythm: Normal rate and regular rhythm.     Pulses: Normal pulses.     Heart sounds: Normal heart sounds. No murmur heard. Pulmonary:     Breath sounds: Normal breath sounds. No wheezing or rales.  Musculoskeletal:     Cervical back: Neck supple. No tenderness.     Right lower leg: No edema.     Left lower leg: No edema.  Skin:    General: Skin is warm.     Findings: No rash.  Neurological:     General: No focal deficit present.     Mental Status: He is alert and oriented to person, place, and time.     Cranial Nerves: No cranial nerve deficit.     Sensory: Sensory deficit (B/l feet) present.     Motor: No weakness.  Psychiatric:        Mood and Affect: Mood normal.        Behavior: Behavior normal.     BP 132/60 (BP Location: Left Arm)   Pulse 75   Ht 5\' 10"  (1.778 m)   Wt 193 lb 9.6 oz (87.8 kg)   SpO2 93%   BMI 27.78 kg/m   Wt Readings from Last 3 Encounters:  12/24/22 193 lb 9.6 oz (87.8 kg)  07/24/22 188 lb 6.4 oz (85.5 kg)  03/25/22 188 lb 12.8 oz (85.6 kg)    Lab Results  Component Value Date   TSH 2.790 07/15/2022   Lab Results  Component Value Date   WBC 9.6 03/19/2022   HGB 15.7 03/19/2022   HCT 47.6 03/19/2022   MCV 88 03/19/2022   PLT 250 03/19/2022   Lab Results  Component Value Date   NA 141 12/17/2022   K 4.3 12/17/2022   CO2 21 12/17/2022   GLUCOSE 194 (H) 12/17/2022   BUN 16 12/17/2022   CREATININE 1.03 12/17/2022   BILITOT 0.7 12/17/2022   ALKPHOS 87 12/17/2022   AST 18 12/17/2022   ALT 17 12/17/2022   PROT 6.6 12/17/2022   ALBUMIN 4.5 12/17/2022   CALCIUM 9.4 12/17/2022   ANIONGAP 8 08/13/2016   EGFR 70 12/17/2022   Lab Results  Component Value Date   CHOL 169 03/19/2022   Lab Results  Component Value Date   HDL 38 (L) 03/19/2022   Lab Results  Component Value Date   LDLCALC 103 (H) 03/19/2022   Lab Results  Component Value Date   TRIG 160 (H) 03/19/2022   Lab Results  Component Value Date   CHOLHDL 4.4 03/19/2022   Lab Results  Component Value Date   HGBA1C 7.8 (H) 12/17/2022      Assessment & Plan:   Problem List Items Addressed This Visit  Cardiovascular and Mediastinum   Essential hypertension    BP Readings from Last 1 Encounters:  12/24/22 132/60   Well-controlled currently, not on any medications currently Advised DASH diet and moderate exercise/walking as tolerated for now      Relevant Orders   TSH   CMP14+EGFR   CBC with Differential/Platelet     Respiratory   Chronic obstructive pulmonary disease (HCC)    Well-controlled On Trelegy, uses it PRN, advised to use it QD Does not want to have rescue inhaler despite explaining its role        Endocrine   Type 2 diabetes mellitus with diabetic neuropathy, without long-term current use of insulin (HCC) - Primary    Lab Results  Component Value Date   HGBA1C 7.8 (H)  12/17/2022   Uncontrolled now as he has stopped taking Jardiance On Metformin 1000 mg BID, needs to restart Jardiance 10 mg once daily - but he still wants to think about it Advised to follow diabetic diet Not on statin, does not want to take additional medications F/u CMP and lipid panel Diabetic eye exam: Advised to follow up with Ophthalmology for diabetic eye exam      Relevant Orders   Hemoglobin A1c   CMP14+EGFR   Diabetic polyneuropathy associated with type 2 diabetes mellitus (HCC)    On Gabapentin Symptoms controlled currently        Other   Benign prostatic hyperplasia with nocturia    Was on Flomax, but does not take it Has nocturia, but still prefers to avoid any medicine for it      Elevated PSA    Overall stable around 5-6 Has BPH On Flomax, does not take it regularly Advised urology referral, but he prefers to wait for now Recheck PSA after 6 months      Mixed hyperlipidemia    Does not want to take statin or any additional medicine Follow low carb, low cholesterol diet      Relevant Orders   Lipid panel   Other Visit Diagnoses     Vitamin D deficiency       Relevant Orders   VITAMIN D 25 Hydroxy (Vit-D Deficiency, Fractures)       No orders of the defined types were placed in this encounter.   Follow-up: Return in about 6 months (around 06/23/2023) for Annual physical.    Anabel Halon, MD

## 2022-12-24 NOTE — Assessment & Plan Note (Addendum)
Overall stable around 5-6 Has BPH On Flomax, does not take it regularly Advised urology referral, but he prefers to wait for now Recheck PSA after 6 months

## 2022-12-24 NOTE — Assessment & Plan Note (Signed)
Well-controlled On Trelegy, uses it PRN, advised to use it QD Does not want to have rescue inhaler despite explaining its role 

## 2022-12-24 NOTE — Assessment & Plan Note (Addendum)
Lab Results  Component Value Date   HGBA1C 7.8 (H) 12/17/2022   Uncontrolled now as he has stopped taking Jardiance On Metformin 1000 mg BID, needs to restart Jardiance 10 mg once daily - but he still wants to think about it Advised to follow diabetic diet Not on statin, does not want to take additional medications F/u CMP and lipid panel Diabetic eye exam: Advised to follow up with Ophthalmology for diabetic eye exam

## 2022-12-24 NOTE — Assessment & Plan Note (Addendum)
BP Readings from Last 1 Encounters:  12/24/22 132/60   Well-controlled currently, not on any medications currently Advised DASH diet and moderate exercise/walking as tolerated for now

## 2022-12-24 NOTE — Assessment & Plan Note (Signed)
On Gabapentin Symptoms controlled currently 

## 2022-12-24 NOTE — Assessment & Plan Note (Addendum)
Was on Flomax, but does not take it Has nocturia, but still prefers to avoid any medicine for it

## 2022-12-24 NOTE — Assessment & Plan Note (Addendum)
Does not want to take statin or any additional medicine Follow low carb, low cholesterol diet

## 2023-01-06 ENCOUNTER — Other Ambulatory Visit: Payer: Self-pay

## 2023-01-06 MED ORDER — ACCU-CHEK GUIDE TEST VI STRP: ORAL_STRIP | 12 refills | Status: AC

## 2023-04-22 ENCOUNTER — Other Ambulatory Visit: Payer: Self-pay | Admitting: Internal Medicine

## 2023-04-22 DIAGNOSIS — E1142 Type 2 diabetes mellitus with diabetic polyneuropathy: Secondary | ICD-10-CM

## 2023-05-08 DIAGNOSIS — E119 Type 2 diabetes mellitus without complications: Secondary | ICD-10-CM | POA: Diagnosis not present

## 2023-06-02 ENCOUNTER — Other Ambulatory Visit: Payer: Self-pay | Admitting: Internal Medicine

## 2023-06-02 DIAGNOSIS — E1140 Type 2 diabetes mellitus with diabetic neuropathy, unspecified: Secondary | ICD-10-CM

## 2023-06-17 DIAGNOSIS — E1140 Type 2 diabetes mellitus with diabetic neuropathy, unspecified: Secondary | ICD-10-CM | POA: Diagnosis not present

## 2023-06-17 DIAGNOSIS — E782 Mixed hyperlipidemia: Secondary | ICD-10-CM | POA: Diagnosis not present

## 2023-06-17 DIAGNOSIS — I10 Essential (primary) hypertension: Secondary | ICD-10-CM | POA: Diagnosis not present

## 2023-06-17 DIAGNOSIS — E559 Vitamin D deficiency, unspecified: Secondary | ICD-10-CM | POA: Diagnosis not present

## 2023-06-18 LAB — LIPID PANEL
Chol/HDL Ratio: 4.8 ratio (ref 0.0–5.0)
Cholesterol, Total: 169 mg/dL (ref 100–199)
HDL: 35 mg/dL — ABNORMAL LOW (ref >39)
LDL Chol Calc (NIH): 96 mg/dL (ref 0–99)
Triglycerides: 220 mg/dL — ABNORMAL HIGH (ref 0–149)
VLDL Cholesterol Cal: 38 mg/dL (ref 5–40)

## 2023-06-18 LAB — CBC WITH DIFFERENTIAL/PLATELET
Basophils Absolute: 0.1 10*3/uL (ref 0.0–0.2)
Basos: 1 %
EOS (ABSOLUTE): 0.2 10*3/uL (ref 0.0–0.4)
Eos: 3 %
Hematocrit: 47.7 % (ref 37.5–51.0)
Hemoglobin: 16.1 g/dL (ref 13.0–17.7)
Immature Grans (Abs): 0.1 10*3/uL (ref 0.0–0.1)
Immature Granulocytes: 1 %
Lymphocytes Absolute: 2 10*3/uL (ref 0.7–3.1)
Lymphs: 22 %
MCH: 29.9 pg (ref 26.6–33.0)
MCHC: 33.8 g/dL (ref 31.5–35.7)
MCV: 89 fL (ref 79–97)
Monocytes Absolute: 0.7 10*3/uL (ref 0.1–0.9)
Monocytes: 8 %
Neutrophils Absolute: 6.3 10*3/uL (ref 1.4–7.0)
Neutrophils: 65 %
Platelets: 232 10*3/uL (ref 150–450)
RBC: 5.38 x10E6/uL (ref 4.14–5.80)
RDW: 12.9 % (ref 11.6–15.4)
WBC: 9.4 10*3/uL (ref 3.4–10.8)

## 2023-06-18 LAB — CMP14+EGFR
ALT: 19 IU/L (ref 0–44)
AST: 15 IU/L (ref 0–40)
Albumin: 4.3 g/dL (ref 3.7–4.7)
Alkaline Phosphatase: 88 IU/L (ref 44–121)
BUN/Creatinine Ratio: 14 (ref 10–24)
BUN: 16 mg/dL (ref 8–27)
Bilirubin Total: 0.8 mg/dL (ref 0.0–1.2)
CO2: 22 mmol/L (ref 20–29)
Calcium: 9.5 mg/dL (ref 8.6–10.2)
Chloride: 102 mmol/L (ref 96–106)
Creatinine, Ser: 1.11 mg/dL (ref 0.76–1.27)
Globulin, Total: 2.3 g/dL (ref 1.5–4.5)
Glucose: 163 mg/dL — ABNORMAL HIGH (ref 70–99)
Potassium: 4.4 mmol/L (ref 3.5–5.2)
Sodium: 140 mmol/L (ref 134–144)
Total Protein: 6.6 g/dL (ref 6.0–8.5)
eGFR: 64 mL/min/{1.73_m2} (ref >59)

## 2023-06-18 LAB — HEMOGLOBIN A1C
Est. average glucose Bld gHb Est-mCnc: 160 mg/dL
Hgb A1c MFr Bld: 7.2 % — ABNORMAL HIGH (ref 4.8–5.6)

## 2023-06-18 LAB — TSH: TSH: 4.94 u[IU]/mL — ABNORMAL HIGH (ref 0.450–4.500)

## 2023-06-18 LAB — VITAMIN D 25 HYDROXY (VIT D DEFICIENCY, FRACTURES): Vit D, 25-Hydroxy: 33.4 ng/mL (ref 30.0–100.0)

## 2023-06-23 ENCOUNTER — Ambulatory Visit (INDEPENDENT_AMBULATORY_CARE_PROVIDER_SITE_OTHER): Payer: Medicare Other | Admitting: Internal Medicine

## 2023-06-23 ENCOUNTER — Encounter: Payer: Self-pay | Admitting: Internal Medicine

## 2023-06-23 VITALS — BP 123/64 | HR 76 | Ht 70.0 in | Wt 191.0 lb

## 2023-06-23 DIAGNOSIS — Z0001 Encounter for general adult medical examination with abnormal findings: Secondary | ICD-10-CM

## 2023-06-23 DIAGNOSIS — H906 Mixed conductive and sensorineural hearing loss, bilateral: Secondary | ICD-10-CM

## 2023-06-23 DIAGNOSIS — E1142 Type 2 diabetes mellitus with diabetic polyneuropathy: Secondary | ICD-10-CM | POA: Diagnosis not present

## 2023-06-23 DIAGNOSIS — I10 Essential (primary) hypertension: Secondary | ICD-10-CM

## 2023-06-23 DIAGNOSIS — J439 Emphysema, unspecified: Secondary | ICD-10-CM

## 2023-06-23 DIAGNOSIS — R972 Elevated prostate specific antigen [PSA]: Secondary | ICD-10-CM

## 2023-06-23 DIAGNOSIS — E1140 Type 2 diabetes mellitus with diabetic neuropathy, unspecified: Secondary | ICD-10-CM | POA: Diagnosis not present

## 2023-06-23 DIAGNOSIS — Z7984 Long term (current) use of oral hypoglycemic drugs: Secondary | ICD-10-CM

## 2023-06-23 DIAGNOSIS — N401 Enlarged prostate with lower urinary tract symptoms: Secondary | ICD-10-CM

## 2023-06-23 DIAGNOSIS — E038 Other specified hypothyroidism: Secondary | ICD-10-CM

## 2023-06-23 DIAGNOSIS — R351 Nocturia: Secondary | ICD-10-CM

## 2023-06-23 MED ORDER — METFORMIN HCL 1000 MG PO TABS
1000.0000 mg | ORAL_TABLET | Freq: Two times a day (BID) | ORAL | 2 refills | Status: AC
Start: 2023-06-23 — End: ?

## 2023-06-23 NOTE — Assessment & Plan Note (Signed)
 BP Readings from Last 1 Encounters:  06/23/23 123/64   Well-controlled currently, not on any medications currently Advised DASH diet and moderate exercise/walking as tolerated for now

## 2023-06-23 NOTE — Assessment & Plan Note (Signed)
Well-controlled On Trelegy, uses it PRN, advised to use it QD Does not want to have rescue inhaler despite explaining its role 

## 2023-06-23 NOTE — Assessment & Plan Note (Signed)
Physical exam as documented. Fasting blood tests reviewed today. Refuses flu and PCV20 vaccines.

## 2023-06-23 NOTE — Assessment & Plan Note (Signed)
Asymptomatic ?Recheck TSH and free T4 ?

## 2023-06-23 NOTE — Progress Notes (Signed)
 Established Patient Office Visit  Subjective:  Patient ID: Jeremy Leon, male    DOB: 11/18/1932  Age: 89 y.o. MRN: [id number]  CC:  Chief Complaint  Patient presents with   Annual Exam    HPI Jeremy Leon is a 89 y.o. male with past medical history of type II DM with neuropathy, HTN and COPD who presents for f/u of his chronic medical conditions.  Type II DM: His HbA1C has improved to 7.2.  He is taking metformin  1000 mg twice daily, but has stopped taking Jardiance  now.  His blood glucose ranges between 120-150 most of the time, depending on what he eats.  He admits that he still needs to improve on his diet.  He denies any polyuria or polydipsia.  He also takes gabapentin  for peripheral neuropathy.  HTN: BP is well-controlled. Takes medications regularly. Patient denies headache, dizziness, chest pain, dyspnea or palpitations.   He denies flu and pneumococcal vaccines today.  Past Medical History:  Diagnosis Date   Borderline diabetes    COPD (chronic obstructive pulmonary disease) (HCC)    Decreased hearing of both ears    Diabetes mellitus without complication (HCC)    History of cardiac catheterization    No significant CAD 2007    Past Surgical History:  Procedure Laterality Date   CARDIAC CATHETERIZATION     CATARACT EXTRACTION W/PHACO Right 07/23/2021   Procedure: CATARACT EXTRACTION PHACO AND INTRAOCULAR LENS PLACEMENT (IOC);  Surgeon: Tarri Farm, MD;  Location: [facility];  Service: Ophthalmology;  Laterality: Right;  CDE 16.63   CATARACT EXTRACTION W/PHACO Left 08/06/2021   Procedure: CATARACT EXTRACTION PHACO AND INTRAOCULAR LENS PLACEMENT (IOC);  Surgeon: Tarri Farm, MD;  Location: [facility];  Service: Ophthalmology;  Laterality: Left;  CDE 16.65   TONSILLECTOMY     VASECTOMY      Family History  Problem Relation Age of Onset   Stroke Mother    Colon cancer Father        Died age 79   Leukemia Brother        Died age 70   Diabetes Mellitus II  Brother     Social History   Socioeconomic History   Marital status: Widowed    Spouse name: Not on file   Number of children: 2   Years of education: 56   Highest education level: Some college, no degree  Occupational History   Occupation: retired  Tobacco Use   Smoking status: Former    Current packs/day: 0.00    Average packs/day: 1 pack/day for 30.0 years (30.0 ttl pk-yrs)    Types: Cigarettes    Start date: 02/12/1967    Quit date: 02/11/1997    Years since quitting: 26.3   Smokeless tobacco: Never  Vaping Use   Vaping status: Never Used  Substance and Sexual Activity   Alcohol use: No    Alcohol/week: 0.0 standard drinks of alcohol   Drug use: No   Sexual activity: Yes  Other Topics Concern   Not on file  Social History Narrative         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   Social Drivers of Health   Financial Resource Strain: Low Risk  (06/19/2023)   Overall Financial  Resource Strain (CARDIA)    Difficulty of Paying Living Expenses: Not hard at all  Food Insecurity: No Food Insecurity (06/19/2023)   Hunger Vital Sign    Worried About Running Out of Food in the Last Year: Never true    Ran Out of Food in the Last Year: Never true  Transportation Needs: No Transportation Needs (06/19/2023)   PRAPARE - Administrator, Civil Service (Medical): No    Lack of Transportation (Non-Medical): No  Physical Activity: Unknown (06/19/2023)   Exercise Vital Sign    Days of Exercise per Week: 0 days    Minutes of Exercise per Session: Not on file  Stress: Stress Concern Present (06/19/2023)   [employer] of Occupational Health - Occupational Stress Questionnaire    Feeling of Stress : To some extent  Social Connections: Socially Isolated (06/19/2023)   Social Connection and Isolation Panel [NHANES]     Frequency of Communication with Friends and Family: Once a week    Frequency of Social Gatherings with Friends and Family: Once a week    Attends Religious Services: Never    Database administrator or Organizations: Yes    Attends Banker Meetings: Never    Marital Status: Widowed  Intimate Partner Violence: Not At Risk (03/19/2021)   Humiliation, Afraid, Rape, and Kick questionnaire    Fear of Current or Ex-Partner: No    Emotionally Abused: No    Physically Abused: No    Sexually Abused: No    Outpatient Medications Prior to Visit  Medication Sig Dispense Refill   ACCU-CHEK GUIDE test strip USE AS DIRECTED TO TEST TWICE DAILY. 100 strip 0   aspirin  81 MG tablet Take 81 mg by mouth daily.     cyanocobalamin  1000 MCG tablet Take 100 mcg by mouth daily.     Fluticasone-Umeclidin-Vilant (TRELEGY ELLIPTA ) 100-62.5-25 MCG/ACT AEPB Inhale 1 puff into the lungs daily. 1 each 11   gabapentin  (NEURONTIN ) 300 MG capsule TAKE 1 CAPSULE BY MOUTH AT  BEDTIME 100 capsule 2   glucose blood (ACCU-CHEK GUIDE TEST) test strip Use as instructed 100 each 12   JARDIANCE  10 MG TABS tablet TAKE 1 TABLET BY MOUTH DAILY BEFORE BREAKFAST. 90 tablet 3   Magnesium  250 MG TABS Take 1 tablet by mouth daily.     metFORMIN  (GLUCOPHAGE ) 1000 MG tablet TAKE 1 TABLET BY MOUTH TWICE  DAILY 200 tablet 2   Multiple Vitamins-Minerals (CENTRUM SILVER 50+MEN PO) Take 1 tablet by mouth daily.     Omega-3 Fatty Acids (FISH OIL) 1000 MG CPDR Take 1,000 mg by mouth.     tamsulosin  (FLOMAX ) 0.4 MG CAPS capsule Take 1 capsule (0.4 mg total) by mouth daily. 90 capsule 3   Vitamin D -Vitamin K (D3 + K2 DOTS) 1000-90 UNIT-MCG TABS Take by mouth.     vitamin E  1000 UNIT capsule Take 1,000 Units by mouth daily.     No facility-administered medications prior to visit.    No Active Allergies  ROS Review of Systems  Constitutional:  Positive for fatigue. Negative for chills and fever.  HENT:  Positive for hearing loss.  Negative for congestion and sore throat.   Eyes:  Negative for pain and discharge.  Respiratory:  Negative for cough and shortness of breath.   Cardiovascular:  Negative for chest pain and palpitations.  Gastrointestinal:  Negative for diarrhea, nausea and vomiting.  Endocrine: Negative for polydipsia and polyuria.  Genitourinary:  Negative for dysuria and hematuria.  Musculoskeletal:  Negative for neck pain and neck stiffness.  Skin:  Negative for rash.  Neurological:  Positive for numbness (B/l feet). Negative for dizziness and headaches.  Psychiatric/Behavioral:  Positive for sleep disturbance. Negative for agitation, behavioral problems, self-injury and suicidal ideas.       Objective:    Physical Exam Vitals reviewed.  Constitutional:      General: He is not in acute distress.    Appearance: He is not diaphoretic.  HENT:     Head: Normocephalic and atraumatic.     Nose: Nose normal.     Mouth/Throat:     Mouth: Mucous membranes are moist.  Eyes:     General: No scleral icterus.    Extraocular Movements: Extraocular movements intact.  Cardiovascular:     Rate and Rhythm: Normal rate and regular rhythm.     Pulses: Normal pulses.     Heart sounds: Normal heart sounds. No murmur heard. Pulmonary:     Breath sounds: Normal breath sounds. No wheezing or rales.  Musculoskeletal:     Cervical back: Neck supple. No tenderness.     Right lower leg: No edema.     Left lower leg: No edema.  Skin:    General: Skin is warm.     Findings: No rash.  Neurological:     General: No focal deficit present.     Mental Status: He is alert and oriented to person, place, and time.     Cranial Nerves: No cranial nerve deficit.     Sensory: Sensory deficit (B/l feet) present.     Motor: No weakness.  Psychiatric:        Mood and Affect: Mood normal.        Behavior: Behavior normal.     BP 123/64   Pulse 76   Ht 5\' 10"  (1.778 m)   Wt 191 lb (86.6 kg)   SpO2 94%   BMI 27.41 kg/m   Wt Readings from Last 3 Encounters:  06/23/23 191 lb (86.6 kg)  12/24/22 193 lb 9.6 oz (87.8 kg)  07/24/22 188 lb 6.4 oz (85.5 kg)    Lab Results  Component Value Date   TSH 4.940 (H) 06/17/2023   Lab Results  Component Value Date   WBC 9.4 06/17/2023   HGB 16.1 06/17/2023   HCT 47.7 06/17/2023   MCV 89 06/17/2023   PLT 232 06/17/2023   Lab Results  Component Value Date   NA 140 06/17/2023   K 4.4 06/17/2023   CO2 22 06/17/2023   GLUCOSE 163 (H) 06/17/2023   BUN 16 06/17/2023   CREATININE 1.11 06/17/2023   BILITOT 0.8 06/17/2023   ALKPHOS 88 06/17/2023   AST 15 06/17/2023   ALT 19 06/17/2023   PROT 6.6 06/17/2023   ALBUMIN 4.3 06/17/2023   CALCIUM 9.5 06/17/2023   ANIONGAP 8 08/13/2016   EGFR 64 06/17/2023   Lab Results  Component Value Date   CHOL 169 06/17/2023   Lab Results  Component Value Date   HDL 35 (L) 06/17/2023   Lab Results  Component Value Date   LDLCALC 96 06/17/2023   Lab Results  Component Value Date   TRIG 220 (H) 06/17/2023   Lab Results  Component Value Date   CHOLHDL 4.8 06/17/2023   Lab Results  Component Value Date   HGBA1C 7.2 (H) 06/17/2023      Assessment & Plan:   Problem List Items Addressed This Visit       Cardiovascular and Mediastinum  Essential hypertension   BP Readings from Last 1 Encounters:  06/23/23 123/64   Well-controlled currently, not on any medications currently Advised DASH diet and moderate exercise/walking as tolerated for now        Respiratory   Chronic obstructive pulmonary disease (HCC)   Well-controlled On Trelegy, uses it PRN, advised to use it QD Does not want to have rescue inhaler despite explaining its role        Endocrine   Type 2 diabetes mellitus with diabetic neuropathy, without long-term current use of insulin  (HCC) - Primary   Lab Results  Component Value Date   HGBA1C 7.2 (H) 06/17/2023   Well-controlled for his age On Metformin  1000 mg BID, needs to restart  Jardiance  10 mg once daily - but he still wants to think about it Advised to follow diabetic diet Not on statin, does not want to take additional medications F/u CMP and lipid panel Diabetic eye exam: Advised to follow up with Ophthalmology for diabetic eye exam      Relevant Orders   CMP14+EGFR   Hemoglobin A1c   Diabetic polyneuropathy associated with type 2 diabetes mellitus (HCC)   On Gabapentin  Symptoms controlled currently      Subclinical hypothyroidism   Asymptomatic Recheck TSH and free T4      Relevant Orders   TSH + free T4     Nervous and Auditory   Mixed conductive and sensorineural hearing loss of both ears   Needs hearing test and hearing aide - but has not had evaluation yet        Other   Encounter for general adult medical examination with abnormal findings   Physical exam as documented. Fasting blood tests reviewed today. Refuses flu and PCV20 vaccines.      Benign prostatic hyperplasia with nocturia   Was on Flomax , but does not take it Has nocturia, but still prefers to avoid any medicine for it      Relevant Orders   PSA   Elevated PSA   Overall stable around 5-6 Has BPH On Flomax , does not take it regularly Advised urology referral in the past, but he prefers to wait for now Recheck PSA after 6 months      Relevant Orders   PSA     No orders of the defined types were placed in this encounter.   Follow-up: Return in about 6 months (around 12/24/2023) for DM and HTN.    Meldon Sport, MD

## 2023-06-23 NOTE — Assessment & Plan Note (Signed)
 Overall stable around 5-6 Has BPH On Flomax , does not take it regularly Advised urology referral in the past, but he prefers to wait for now Recheck PSA after 6 months

## 2023-06-23 NOTE — Assessment & Plan Note (Signed)
 Lab Results  Component Value Date   HGBA1C 7.2 (H) 06/17/2023   Well-controlled for his age On Metformin  1000 mg BID, needs to restart Jardiance  10 mg once daily - but he still wants to think about it Advised to follow diabetic diet Not on statin, does not want to take additional medications F/u CMP and lipid panel Diabetic eye exam: Advised to follow up with Ophthalmology for diabetic eye exam

## 2023-06-23 NOTE — Assessment & Plan Note (Signed)
On Gabapentin Symptoms controlled currently 

## 2023-06-23 NOTE — Assessment & Plan Note (Signed)
 Was on Flomax, but does not take it Has nocturia, but still prefers to avoid any medicine for it

## 2023-06-23 NOTE — Assessment & Plan Note (Signed)
 Needs hearing test and hearing aide - but has not had evaluation yet

## 2023-06-23 NOTE — Patient Instructions (Addendum)
 Please continue to take medications as prescribed.  Please continue to follow low carb diet and ambulate as tolerated.  Please get fasting blood tests done before the next visit.

## 2023-12-18 LAB — CMP14+EGFR
ALT: 16 IU/L (ref 0–44)
AST: 17 IU/L (ref 0–40)
Albumin: 4.3 g/dL (ref 3.7–4.7)
Alkaline Phosphatase: 84 IU/L (ref 48–129)
BUN/Creatinine Ratio: 15 (ref 10–24)
BUN: 16 mg/dL (ref 8–27)
Bilirubin Total: 0.6 mg/dL (ref 0.0–1.2)
CO2: 24 mmol/L (ref 20–29)
Calcium: 9.6 mg/dL (ref 8.6–10.2)
Chloride: 101 mmol/L (ref 96–106)
Creatinine, Ser: 1.07 mg/dL (ref 0.76–1.27)
Globulin, Total: 2.3 g/dL (ref 1.5–4.5)
Glucose: 148 mg/dL — ABNORMAL HIGH (ref 70–99)
Potassium: 4.2 mmol/L (ref 3.5–5.2)
Sodium: 140 mmol/L (ref 134–144)
Total Protein: 6.6 g/dL (ref 6.0–8.5)
eGFR: 66 mL/min/1.73 (ref >59)

## 2023-12-18 LAB — TSH+FREE T4
Free T4: 1.07 ng/dL (ref 0.82–1.77)
TSH: 3.92 u[IU]/mL (ref 0.450–4.500)

## 2023-12-18 LAB — PSA: Prostate Specific Ag, Serum: 5.2 ng/mL — ABNORMAL HIGH (ref 0.0–4.0)

## 2023-12-18 LAB — HEMOGLOBIN A1C
Est. average glucose Bld gHb Est-mCnc: 163 mg/dL
Hgb A1c MFr Bld: 7.3 % — ABNORMAL HIGH (ref 4.8–5.6)

## 2023-12-24 ENCOUNTER — Ambulatory Visit: Admitting: Internal Medicine

## 2023-12-24 ENCOUNTER — Encounter: Payer: Self-pay | Admitting: Internal Medicine

## 2023-12-24 VITALS — BP 116/76 | HR 92 | Ht 70.0 in | Wt 191.6 lb

## 2023-12-24 DIAGNOSIS — I10 Essential (primary) hypertension: Secondary | ICD-10-CM

## 2023-12-24 DIAGNOSIS — Z7984 Long term (current) use of oral hypoglycemic drugs: Secondary | ICD-10-CM

## 2023-12-24 DIAGNOSIS — J449 Chronic obstructive pulmonary disease, unspecified: Secondary | ICD-10-CM | POA: Diagnosis not present

## 2023-12-24 DIAGNOSIS — N401 Enlarged prostate with lower urinary tract symptoms: Secondary | ICD-10-CM

## 2023-12-24 DIAGNOSIS — J439 Emphysema, unspecified: Secondary | ICD-10-CM

## 2023-12-24 DIAGNOSIS — Z2821 Immunization not carried out because of patient refusal: Secondary | ICD-10-CM

## 2023-12-24 DIAGNOSIS — E782 Mixed hyperlipidemia: Secondary | ICD-10-CM

## 2023-12-24 DIAGNOSIS — E1142 Type 2 diabetes mellitus with diabetic polyneuropathy: Secondary | ICD-10-CM

## 2023-12-24 DIAGNOSIS — E559 Vitamin D deficiency, unspecified: Secondary | ICD-10-CM

## 2023-12-24 DIAGNOSIS — E1140 Type 2 diabetes mellitus with diabetic neuropathy, unspecified: Secondary | ICD-10-CM

## 2023-12-24 DIAGNOSIS — R351 Nocturia: Secondary | ICD-10-CM

## 2023-12-24 NOTE — Assessment & Plan Note (Addendum)
 Lab Results  Component Value Date   HGBA1C 7.3 (H) 12/17/2023   Well-controlled for his age On Metformin  1000 mg BID DC Jardiance  as he prefers to avoid it due to cost concern and does not want to take more than 1 medicine for DM Advised to follow diabetic diet Not on statin, does not want to take additional medications F/u CMP and lipid panel Diabetic eye exam: Advised to follow up with Ophthalmology for diabetic eye exam

## 2023-12-24 NOTE — Patient Instructions (Addendum)
 Please continue to take medications as prescribed.  Please continue to follow low carb diet and ambulate as tolerated.  Please get fasting blood tests done before the next visit.

## 2023-12-24 NOTE — Assessment & Plan Note (Signed)
Well-controlled On Trelegy, uses it PRN, advised to use it QD Does not want to have rescue inhaler despite explaining its role 

## 2023-12-24 NOTE — Assessment & Plan Note (Signed)
 BP Readings from Last 1 Encounters:  12/24/23 116/76   Well-controlled currently, not on any medications currently Advised DASH diet and moderate exercise/walking as tolerated for now

## 2023-12-24 NOTE — Assessment & Plan Note (Addendum)
 On Gabapentin  300 mg qHS Has numbness of b/l LE, but denies neuropathic pain - symptoms controlled currently

## 2023-12-24 NOTE — Progress Notes (Signed)
 Established Patient Office Visit  Subjective:  Patient ID: Jeremy Leon, male    DOB: 23-Aug-1932  Age: 89 y.o. MRN: [id number]  CC:  Chief Complaint  Patient presents with   Diabetes    6 month f/u     HPI Jeremy Leon is a 89 y.o. male with past medical history of type II DM with neuropathy, HTN and COPD who presents for f/u of his chronic medical conditions.  Type II DM: His HbA1C is stable at 7.3.  He is taking metformin  1000 mg twice daily, but has stopped taking Jardiance  now.  His blood glucose ranges between 120-150 most of the time, depending on what he eats.  He admits that he still needs to improve on his diet.  He denies any polyuria or polydipsia.  He also takes gabapentin  for peripheral neuropathy.  HTN: BP is well-controlled. Takes medications regularly. Patient denies headache, dizziness, chest pain, dyspnea or palpitations.   He denies flu and pneumococcal vaccines today.  Past Medical History:  Diagnosis Date   Borderline diabetes    COPD (chronic obstructive pulmonary disease) (HCC)    Decreased hearing of both ears    Diabetes mellitus without complication (HCC)    History of cardiac catheterization    No significant CAD 2007    Past Surgical History:  Procedure Laterality Date   CARDIAC CATHETERIZATION     CATARACT EXTRACTION W/PHACO Right 07/23/2021   Procedure: CATARACT EXTRACTION PHACO AND INTRAOCULAR LENS PLACEMENT (IOC);  Surgeon: Harrie Lynwood, MD;  Location: [facility];  Service: Ophthalmology;  Laterality: Right;  CDE 16.63   CATARACT EXTRACTION W/PHACO Left 08/06/2021   Procedure: CATARACT EXTRACTION PHACO AND INTRAOCULAR LENS PLACEMENT (IOC);  Surgeon: Harrie Lynwood, MD;  Location: [facility];  Service: Ophthalmology;  Laterality: Left;  CDE 16.65   TONSILLECTOMY     VASECTOMY      Family History  Problem Relation Age of Onset   Stroke Mother    Colon cancer Father        Died age 60   Leukemia Brother        Died age 80   Diabetes  Mellitus II Brother     Social History   Socioeconomic History   Marital status: Widowed    Spouse name: Not on file   Number of children: 2   Years of education: 49   Highest education level: Some college, no degree  Occupational History   Occupation: retired  Tobacco Use   Smoking status: Former    Current packs/day: 0.00    Average packs/day: 1 pack/day for 30.0 years (30.0 ttl pk-yrs)    Types: Cigarettes    Start date: 02/12/1967    Quit date: 02/11/1997    Years since quitting: 26.8   Smokeless tobacco: Never  Vaping Use   Vaping status: Never Used  Substance and Sexual Activity   Alcohol use: No    Alcohol/week: 0.0 standard drinks of alcohol   Drug use: No   Sexual activity: Yes  Other Topics Concern   Not on file  Social History Narrative         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   Social Drivers of Corporate Investment Banker Strain: Low Risk  (  06/19/2023)   Overall Financial Resource Strain (CARDIA)    Difficulty of Paying Living Expenses: Not hard at all  Food Insecurity: No Food Insecurity (06/19/2023)   Hunger Vital Sign    Worried About Running Out of Food in the Last Year: Never true    Ran Out of Food in the Last Year: Never true  Transportation Needs: No Transportation Needs (06/19/2023)   PRAPARE - Administrator, Civil Service (Medical): No    Lack of Transportation (Non-Medical): No  Physical Activity: Unknown (06/19/2023)   Exercise Vital Sign    Days of Exercise per Week: 0 days    Minutes of Exercise per Session: Not on file  Stress: Stress Concern Present (06/19/2023)   [employer] of Occupational Health - Occupational Stress Questionnaire    Feeling of Stress : To some extent  Social Connections: Socially Isolated (06/19/2023)   Social Connection and Isolation Panel     Frequency of Communication with Friends and Family: Once a week    Frequency of Social Gatherings with Friends and Family: Once a week    Attends Religious Services: Never    Database Administrator or Organizations: Yes    Attends Banker Meetings: Never    Marital Status: Widowed  Intimate Partner Violence: Not At Risk (03/19/2021)   Humiliation, Afraid, Rape, and Kick questionnaire    Fear of Current or Ex-Partner: No    Emotionally Abused: No    Physically Abused: No    Sexually Abused: No    Outpatient Medications Prior to Visit  Medication Sig Dispense Refill   ACCU-CHEK GUIDE test strip USE AS DIRECTED TO TEST TWICE DAILY. 100 strip 0   aspirin  81 MG tablet Take 81 mg by mouth daily.     cyanocobalamin  1000 MCG tablet Take 100 mcg by mouth daily.     Fluticasone-Umeclidin-Vilant (TRELEGY ELLIPTA ) 100-62.5-25 MCG/ACT AEPB Inhale 1 puff into the lungs daily. 1 each 11   gabapentin  (NEURONTIN ) 300 MG capsule TAKE 1 CAPSULE BY MOUTH AT  BEDTIME 100 capsule 2   glucose blood (ACCU-CHEK GUIDE TEST) test strip Use as instructed 100 each 12   Magnesium  250 MG TABS Take 1 tablet by mouth daily.     metFORMIN  (GLUCOPHAGE ) 1000 MG tablet Take 1 tablet (1,000 mg total) by mouth 2 (two) times daily. 200 tablet 2   Multiple Vitamins-Minerals (CENTRUM SILVER 50+MEN PO) Take 1 tablet by mouth daily.     Omega-3 Fatty Acids (FISH OIL) 1000 MG CPDR Take 1,000 mg by mouth.     Vitamin D -Vitamin K (D3 + K2 DOTS) 1000-90 UNIT-MCG TABS Take by mouth.     vitamin E  1000 UNIT capsule Take 1,000 Units by mouth daily.     JARDIANCE  10 MG TABS tablet TAKE 1 TABLET BY MOUTH DAILY BEFORE BREAKFAST. 90 tablet 3   tamsulosin  (FLOMAX ) 0.4 MG CAPS capsule Take 1 capsule (0.4 mg total) by mouth daily. 90 capsule 3   No facility-administered medications prior to visit.    No Active Allergies  ROS Review of Systems  Constitutional:  Positive for fatigue. Negative for chills and fever.  HENT:   Positive for hearing loss. Negative for congestion and sore throat.   Eyes:  Negative for pain and discharge.  Respiratory:  Negative for cough and shortness of breath.   Cardiovascular:  Negative for chest pain and palpitations.  Gastrointestinal:  Negative for diarrhea, nausea and vomiting.  Endocrine: Negative for polydipsia and polyuria.  Genitourinary:  Negative for dysuria and hematuria.  Musculoskeletal:  Negative for neck pain and neck stiffness.  Skin:  Negative for rash.  Neurological:  Positive for numbness (B/l feet). Negative for dizziness and headaches.  Psychiatric/Behavioral:  Positive for sleep disturbance. Negative for agitation, behavioral problems, self-injury and suicidal ideas.       Objective:    Physical Exam Vitals reviewed.  Constitutional:      General: He is not in acute distress.    Appearance: He is not diaphoretic.  HENT:     Head: Normocephalic and atraumatic.     Nose: Nose normal.     Mouth/Throat:     Mouth: Mucous membranes are moist.  Eyes:     General: No scleral icterus.    Extraocular Movements: Extraocular movements intact.  Cardiovascular:     Rate and Rhythm: Normal rate and regular rhythm.     Heart sounds: Normal heart sounds. No murmur heard. Pulmonary:     Breath sounds: Normal breath sounds. No wheezing or rales.  Musculoskeletal:     Cervical back: Neck supple. No tenderness.     Right lower leg: No edema.     Left lower leg: No edema.  Skin:    General: Skin is warm.     Findings: No rash.  Neurological:     General: No focal deficit present.     Mental Status: He is alert and oriented to person, place, and time.     Cranial Nerves: No cranial nerve deficit.     Sensory: Sensory deficit (B/l feet) present.     Motor: No weakness.  Psychiatric:        Mood and Affect: Mood normal.        Behavior: Behavior normal.     BP 116/76 (BP Location: Left Arm)   Pulse 92   Ht 5' 10 (1.778 m)   Wt 191 lb 9.6 oz (86.9 kg)    SpO2 96%   BMI 27.49 kg/m  Wt Readings from Last 3 Encounters:  12/24/23 191 lb 9.6 oz (86.9 kg)  06/23/23 191 lb (86.6 kg)  12/24/22 193 lb 9.6 oz (87.8 kg)    Lab Results  Component Value Date   TSH 3.920 12/17/2023   Lab Results  Component Value Date   WBC 9.4 06/17/2023   HGB 16.1 06/17/2023   HCT 47.7 06/17/2023   MCV 89 06/17/2023   PLT 232 06/17/2023   Lab Results  Component Value Date   NA 140 12/17/2023   K 4.2 12/17/2023   CO2 24 12/17/2023   GLUCOSE 148 (H) 12/17/2023   BUN 16 12/17/2023   CREATININE 1.07 12/17/2023   BILITOT 0.6 12/17/2023   ALKPHOS 84 12/17/2023   AST 17 12/17/2023   ALT 16 12/17/2023   PROT 6.6 12/17/2023   ALBUMIN 4.3 12/17/2023   CALCIUM 9.6 12/17/2023   ANIONGAP 8 08/13/2016   EGFR 66 12/17/2023   Lab Results  Component Value Date   CHOL 169 06/17/2023   Lab Results  Component Value Date   HDL 35 (L) 06/17/2023   Lab Results  Component Value Date   LDLCALC 96 06/17/2023   Lab Results  Component Value Date   TRIG 220 (H) 06/17/2023   Lab Results  Component Value Date   CHOLHDL 4.8 06/17/2023   Lab Results  Component Value Date   HGBA1C 7.3 (H) 12/17/2023      Assessment & Plan:   Problem List Items Addressed This Visit  Cardiovascular and Mediastinum   Essential hypertension - Primary   BP Readings from Last 1 Encounters:  12/24/23 116/76   Well-controlled currently, not on any medications currently Advised DASH diet and moderate exercise/walking as tolerated for now      Relevant Orders   TSH   CMP14+EGFR   CBC with Differential/Platelet     Respiratory   Chronic obstructive pulmonary disease (HCC)   Well-controlled On Trelegy, uses it PRN, advised to use it QD Does not want to have rescue inhaler despite explaining its role        Endocrine   Type 2 diabetes mellitus with diabetic neuropathy, without long-term current use of insulin  (HCC)   Lab Results  Component Value Date    HGBA1C 7.3 (H) 12/17/2023   Well-controlled for his age On Metformin  1000 mg BID DC Jardiance  as he prefers to avoid it due to cost concern and does not want to take more than 1 medicine for DM Advised to follow diabetic diet Not on statin, does not want to take additional medications F/u CMP and lipid panel Diabetic eye exam: Advised to follow up with Ophthalmology for diabetic eye exam      Relevant Orders   Hemoglobin A1c   CMP14+EGFR   Diabetic polyneuropathy associated with type 2 diabetes mellitus (HCC)   On Gabapentin  300 mg qHS Has numbness of b/l LE, but denies neuropathic pain - symptoms controlled currently        Other   Benign prostatic hyperplasia with nocturia   Was on Flomax , but does not take it Has nocturia, but still prefers to avoid any medicine for it Has borderline elevated PSA ~5-6, denies Urology referral      Refused influenza vaccine   Mixed hyperlipidemia   Does not want to take statin or any additional medicine Follow low carb, low cholesterol diet      Relevant Orders   Lipid panel   Other Visit Diagnoses       Vitamin D  deficiency       Relevant Orders   VITAMIN D  25 Hydroxy (Vit-D Deficiency, Fractures)         No orders of the defined types were placed in this encounter.   Follow-up: Return in about 6 months (around 06/22/2024) for Annual physical (after 06/23/24).    Jeremy MARLA Blanch, MD

## 2023-12-25 NOTE — Assessment & Plan Note (Signed)
 Does not want to take statin or any additional medicine Follow low carb, low cholesterol diet

## 2023-12-25 NOTE — Assessment & Plan Note (Addendum)
 Was on Flomax , but does not take it Has nocturia, but still prefers to avoid any medicine for it Has borderline elevated PSA ~5-6, denies Urology referral

## 2024-06-22 ENCOUNTER — Ambulatory Visit: Admitting: Internal Medicine
